# Patient Record
Sex: Male | Born: 1935 | Race: White | Hispanic: No | Marital: Married | Smoking: Never smoker
Health system: Southern US, Community
[De-identification: ages and names within clinical notes are randomized; demographics above are authoritative.]

## PROBLEM LIST (undated history)

## (undated) DIAGNOSIS — G610 Guillain-Barre syndrome: Secondary | ICD-10-CM

## (undated) DIAGNOSIS — Z0110 Encounter for examination of ears and hearing without abnormal findings: Secondary | ICD-10-CM

## (undated) DIAGNOSIS — M47817 Spondylosis without myelopathy or radiculopathy, lumbosacral region: Secondary | ICD-10-CM

## (undated) DIAGNOSIS — R51 Headache: Secondary | ICD-10-CM

## (undated) DIAGNOSIS — N400 Enlarged prostate without lower urinary tract symptoms: Secondary | ICD-10-CM

## (undated) DIAGNOSIS — E7800 Pure hypercholesterolemia, unspecified: Secondary | ICD-10-CM

## (undated) DIAGNOSIS — N529 Male erectile dysfunction, unspecified: Secondary | ICD-10-CM

## (undated) DIAGNOSIS — F0390 Unspecified dementia without behavioral disturbance: Secondary | ICD-10-CM

## (undated) DIAGNOSIS — I10 Essential (primary) hypertension: Secondary | ICD-10-CM

## (undated) HISTORY — DX: Headache: R51

## (undated) HISTORY — DX: Male erectile dysfunction, unspecified: N52.9

## (undated) HISTORY — DX: Encounter for examination of ears and hearing without abnormal findings: Z01.10

## (undated) HISTORY — PX: TRANSURETHRAL RESECTION OF PROSTATE: SHX73

## (undated) HISTORY — DX: Guillain-Barre syndrome: G61.0

## (undated) HISTORY — PX: CATARACT EXTRACTION: SUR2

## (undated) HISTORY — DX: Benign prostatic hyperplasia without lower urinary tract symptoms: N40.0

## (undated) HISTORY — DX: Spondylosis without myelopathy or radiculopathy, lumbosacral region: M47.817

## (undated) HISTORY — PX: AMPUTATION: SHX166

## (undated) HISTORY — PX: OTHER SURGICAL HISTORY: SHX169

---

## 1998-01-17 ENCOUNTER — Ambulatory Visit (HOSPITAL_COMMUNITY): Admission: RE | Admit: 1998-01-17 | Discharge: 1998-01-17 | Payer: Self-pay | Admitting: Neurosurgery

## 2002-04-26 ENCOUNTER — Encounter: Payer: Self-pay | Admitting: Internal Medicine

## 2002-04-26 ENCOUNTER — Encounter: Admission: RE | Admit: 2002-04-26 | Discharge: 2002-04-26 | Payer: Self-pay | Admitting: Internal Medicine

## 2003-01-18 ENCOUNTER — Ambulatory Visit (HOSPITAL_COMMUNITY): Admission: RE | Admit: 2003-01-18 | Discharge: 2003-01-18 | Payer: Self-pay | Admitting: Gastroenterology

## 2010-02-27 ENCOUNTER — Encounter: Admission: RE | Admit: 2010-02-27 | Discharge: 2010-02-27 | Payer: Self-pay | Admitting: Internal Medicine

## 2011-02-13 NOTE — Op Note (Signed)
   NAME:  Jesus Shah, Jesus Shah NO.:  [id number]   MEDICAL RECORD NO.:  [id number]                   PATIENT TYPE:  AMB   LOCATION:  ENDO                                 FACILITY:  [facility]   PHYSICIAN:  Danise Edge, M.D.                DATE OF BIRTH:  Sep 14, 1933   DATE OF PROCEDURE:  01/18/2003  DATE OF DISCHARGE:                                 OPERATIVE REPORT   PROCEDURE PERFORMED:  Screening colonoscopy.   ENDOSCOPIST:  Charolett Bumpers, M.D.   INDICATIONS FOR PROCEDURE:  The patient is a 77 year old male born [birth year].  The patient is scheduled to undergo his first screening  colonoscopy with polypectomy to prevent colon cancer.   PREMEDICATION:  Demerol 50 mg, Versed 5 mg.   DESCRIPTION OF PROCEDURE:  After obtaining informed consent, the patient was  placed in the left lateral decubitus position.  I administered intravenous  Demerol and intravenous Versed to achieve conscious sedation for the  procedure.  The patient's blood pressure, oxygen saturations and cardiac  rhythm were monitored throughout the procedure and documented in the medical  record.   Anal inspection was normal.  Digital rectal exam was normal.  The prostate  was nonnodular.  The pediatric Olympus colonoscope was introduced into the  rectum and advanced to the cecum.  A normal-appearing ileocecal valve was  intubated and the distal ileum inspected.  Colonic preparation for the exam  today was excellent.   Rectum:  Normal.   Sigmoid colon and descending colon:  Normal.   Splenic flexure:  Normal.   Transverse colon:  Normal.   Hepatic flexure:  Normal.   Ascending colon:  Normal.   Cecum and ileocecal valve:  Normal.   Distal ileum:  Normal.   ASSESSMENT:  Normal screening proctocolonoscopy of the cecum.                                                   Danise Edge, M.D.    MJ/MEDQ  D:  01/18/2003  T:  01/18/2003  Job:  [id number]   cc:   Thora Lance, M.D.  [address]  [place]  [place] [address]  Fax: [phone]

## 2012-08-15 DIAGNOSIS — R413 Other amnesia: Secondary | ICD-10-CM | POA: Insufficient documentation

## 2012-12-06 ENCOUNTER — Emergency Department (HOSPITAL_COMMUNITY)
Admission: EM | Admit: 2012-12-06 | Discharge: 2012-12-06 | Disposition: A | Discharge status: Home / Self Care | Payer: Medicare Other | Attending: Emergency Medicine | Admitting: Emergency Medicine

## 2012-12-06 ENCOUNTER — Emergency Department (HOSPITAL_COMMUNITY): Payer: Medicare Other

## 2012-12-06 ENCOUNTER — Encounter (HOSPITAL_COMMUNITY): Payer: Self-pay | Admitting: Emergency Medicine

## 2012-12-06 DIAGNOSIS — I10 Essential (primary) hypertension: Secondary | ICD-10-CM | POA: Insufficient documentation

## 2012-12-06 DIAGNOSIS — D696 Thrombocytopenia, unspecified: Secondary | ICD-10-CM | POA: Insufficient documentation

## 2012-12-06 DIAGNOSIS — F0390 Unspecified dementia without behavioral disturbance: Secondary | ICD-10-CM | POA: Insufficient documentation

## 2012-12-06 DIAGNOSIS — R197 Diarrhea, unspecified: Secondary | ICD-10-CM | POA: Insufficient documentation

## 2012-12-06 DIAGNOSIS — E7800 Pure hypercholesterolemia, unspecified: Secondary | ICD-10-CM | POA: Insufficient documentation

## 2012-12-06 DIAGNOSIS — R112 Nausea with vomiting, unspecified: Secondary | ICD-10-CM | POA: Insufficient documentation

## 2012-12-06 DIAGNOSIS — Z79899 Other long term (current) drug therapy: Secondary | ICD-10-CM | POA: Insufficient documentation

## 2012-12-06 DIAGNOSIS — R5381 Other malaise: Secondary | ICD-10-CM | POA: Insufficient documentation

## 2012-12-06 DIAGNOSIS — Z7982 Long term (current) use of aspirin: Secondary | ICD-10-CM | POA: Insufficient documentation

## 2012-12-06 HISTORY — DX: Essential (primary) hypertension: I10

## 2012-12-06 HISTORY — DX: Pure hypercholesterolemia, unspecified: E78.00

## 2012-12-06 HISTORY — DX: Unspecified dementia, unspecified severity, without behavioral disturbance, psychotic disturbance, mood disturbance, and anxiety: F03.90

## 2012-12-06 LAB — COMPREHENSIVE METABOLIC PANEL
ALT: 12 U/L (ref 0–53)
Alkaline Phosphatase: 65 U/L (ref 39–117)
BUN: 33 mg/dL — ABNORMAL HIGH (ref 6–23)
CO2: 22 mEq/L (ref 19–32)
Calcium: 9.1 mg/dL (ref 8.4–10.5)
GFR calc Af Amer: 73 mL/min — ABNORMAL LOW (ref >90)
GFR calc non Af Amer: 63 mL/min — ABNORMAL LOW (ref >90)
Glucose, Bld: 117 mg/dL — ABNORMAL HIGH (ref 70–99)
Potassium: 3.7 mEq/L (ref 3.5–5.1)
Sodium: 133 mEq/L — ABNORMAL LOW (ref 135–145)
Total Protein: 6.7 g/dL (ref 6.0–8.3)

## 2012-12-06 LAB — CBC WITH DIFFERENTIAL/PLATELET
Basophils Absolute: 0 10*3/uL (ref 0.0–0.1)
Basophils Relative: 0 % (ref 0–1)
Lymphocytes Relative: 9 % — ABNORMAL LOW (ref 12–46)
MCHC: 35.6 g/dL (ref 30.0–36.0)
Neutro Abs: 6.9 10*3/uL (ref 1.7–7.7)
Neutrophils Relative %: 87 % — ABNORMAL HIGH (ref 43–77)
RDW: 13.6 % (ref 11.5–15.5)
WBC: 8 10*3/uL (ref 4.0–10.5)

## 2012-12-06 LAB — POCT I-STAT TROPONIN I: Troponin i, poc: 0.01 ng/mL (ref 0.00–0.08)

## 2012-12-06 LAB — URINALYSIS, ROUTINE W REFLEX MICROSCOPIC
Leukocytes, UA: NEGATIVE
Nitrite: NEGATIVE
Specific Gravity, Urine: 1.025 (ref 1.005–1.030)
Urobilinogen, UA: 1 mg/dL (ref 0.0–1.0)
pH: 5.5 (ref 5.0–8.0)

## 2012-12-06 MED ORDER — SODIUM CHLORIDE 0.9 % IV SOLN
Freq: Once | INTRAVENOUS | Status: AC
  Administered 2012-12-06: 08:00:00 via INTRAVENOUS

## 2012-12-06 MED ORDER — SODIUM CHLORIDE 0.9 % IV BOLUS (SEPSIS)
1000.0000 mL | Freq: Once | INTRAVENOUS | Status: AC
  Administered 2012-12-06: 1000 mL via INTRAVENOUS

## 2012-12-06 MED ORDER — ONDANSETRON 4 MG PO TBDP: 4.0000 mg | ORAL_TABLET | Freq: Three times a day (TID) | ORAL | Status: DC | PRN

## 2012-12-06 NOTE — ED Provider Notes (Signed)
History     CSN: [id number]  Arrival date & time 12/06/12  4098   First MD Initiated Contact with Patient 12/06/12 [phone]      Chief Complaint  Patient presents with  . Emesis    (Consider location/radiation/quality/duration/timing/severity/associated sxs/prior treatment) HPI Comments: Level V caveat apply secondary to dementia  The patient is a 79 year old male with a history of mild hypertension, mild hypercholesterolemia who presents after being found by his wife yesterday with decreased level of consciousness. The patient had reported to her that he had nausea vomiting and diarrhea throughout the day while she was at work, she came around 5:00 and found him sitting in his recliner half closed with evidence of vomit around him. He was severely weak and had much difficulty with walking though it did not appear to be focal weakness according to his spouse. The family member discussed his care with Dr. Anne Hahn per her report and the decision was made to bring to the hospital should he not improve. This morning after having another episode of diarrhea last night and generalized weakness this morning was brought in for evaluation. The spouse and the patient reports that the diarrhea was watery, the patient denies any specific loss of consciousness but does not have a good memory of the events of today. The spouse denies fevers, rashes, swelling, coughing.  The history is provided by the patient and the spouse.    Past Medical History  Diagnosis Date  . Hypertension   . Dementia   . High cholesterol     No past surgical history on file.  No family history on file.  History  Substance Use Topics  . Smoking status: Not on file  . Smokeless tobacco: Not on file  . Alcohol Use: Not on file      Review of Systems  Unable to perform ROS: Dementia    Allergies  Aricept  Home Medications   Current Outpatient Rx  Name  Route  Sig  Dispense  Refill  . acetaminophen (TYLENOL) 500  MG tablet   Oral   Take 500 mg by mouth every 6 (six) hours as needed for pain.         Marland Kitchen aspirin EC 81 MG tablet   Oral   Take 81 mg by mouth daily.         . cholecalciferol (VITAMIN D) 1000 UNITS tablet   Oral   Take 1,000 Units by mouth daily.         . hydrochlorothiazide (MICROZIDE) 12.5 MG capsule   Oral   Take 12.5 mg by mouth daily.         . memantine (NAMENDA) 10 MG tablet   Oral   Take 10 mg by mouth 2 (two) times daily.         Marland Kitchen OVER THE COUNTER MEDICATION   Oral   Take 1 capsule by mouth daily as needed. For constipationj         . rivastigmine (EXELON) 9.5 mg/24hr   Transdermal   Place 1 patch onto the skin daily.         . rosuvastatin (CRESTOR) 5 MG tablet   Oral   Take 5 mg by mouth daily.         . tamsulosin (FLOMAX) 0.4 MG CAPS   Oral   Take 0.4 mg by mouth.         . ondansetron (ZOFRAN ODT) 4 MG disintegrating tablet   Oral   Take 1 tablet (  4 mg total) by mouth every 8 (eight) hours as needed for nausea.   10 tablet   0     BP 117/56  Pulse 68  Temp(Src) 98.5 F (36.9 C)  Resp 17  SpO2 100%  Physical Exam  Nursing note and vitals reviewed. Constitutional: He appears well-developed and well-nourished. No distress.  HENT:  Head: Normocephalic and atraumatic.  Mouth/Throat: Oropharynx is clear and moist. No oropharyngeal exudate.  No signs of head trauma, mucous membranes moist  Eyes: Conjunctivae and EOM are normal. Pupils are equal, round, and reactive to light. Right eye exhibits no discharge. Left eye exhibits no discharge. No scleral icterus.  Neck: Normal range of motion. Neck supple. No JVD present. No thyromegaly present.  Cardiovascular: Normal rate, regular rhythm, normal heart sounds and intact distal pulses.  Exam reveals no gallop and no friction rub.   No murmur heard. Pulmonary/Chest: Effort normal and breath sounds normal. No respiratory distress. He has no wheezes. He has no rales.  Abdominal: Soft.  Bowel sounds are normal. He exhibits no distension and no mass. There is no tenderness.  Normal bowel sounds, no tenderness, normal appearing abdomen, no hepatosplenomegaly  Musculoskeletal: Normal range of motion. He exhibits no edema and no tenderness.  Lymphadenopathy:    He has no cervical adenopathy.  Neurological: He is alert. Coordination normal.  Patient is able to follow commands without difficulty, normal grip strength, able to straight leg raise with 5 out of 5 strength bilaterally, cranial nerves III through XII appear to be intact, finger-nose-finger intact without significant ataxia though the patient does have some difficulty remembering how to go from the finger to nose. With mild redirection he is able to perform this task. There is no asymmetry to strength or sensation, no facial droop  Skin: Skin is warm and dry. No rash noted. No erythema.  Psychiatric: He has a normal mood and affect. His behavior is normal.    ED Course  Procedures (including critical care time)  Labs Reviewed  COMPREHENSIVE METABOLIC PANEL - Abnormal; Notable for the following:    Sodium 133 (*)    Glucose, Bld 117 (*)    BUN 33 (*)    Albumin 3.4 (*)    GFR calc non Af Amer 63 (*)    GFR calc Af Amer 73 (*)    All other components within normal limits  CBC WITH DIFFERENTIAL - Abnormal; Notable for the following:    HCT 37.4 (*)    Platelets 129 (*)    Neutrophils Relative 87 (*)    Lymphocytes Relative 9 (*)    All other components within normal limits  URINALYSIS, ROUTINE W REFLEX MICROSCOPIC - Abnormal; Notable for the following:    Bilirubin Urine SMALL (*)    All other components within normal limits  POCT I-STAT TROPONIN I   Ct Head Wo Contrast  12/06/2012  *RADIOLOGY REPORT*  Clinical Data: Poorly responsive, nausea/vomiting, dementia  CT HEAD WITHOUT CONTRAST  Technique:  Contiguous axial images were obtained from the base of the skull through the vertex without contrast.  Comparison:  02/27/2010  Findings: No evidence of parenchymal hemorrhage or extra-axial fluid collection. No mass lesion, mass effect, or midline shift.  No CT evidence of acute infarction.  Subcortical white matter and periventricular small vessel ischemic changes.  Global cortical atrophy.  No ventriculomegaly.  The visualized paranasal sinuses are essentially clear. The mastoid air cells are unopacified.  No evidence of calvarial fracture.  IMPRESSION: No evidence of  acute intracranial abnormality.  Atrophy with small vessel ischemic changes.   Original Report Authenticated By: Charline Bills, M.D.      1. Nausea and vomiting   2. Diarrhea   3. Thrombocytopenia       MDM  Vital signs appear normal, the patient is not have any focal weakness, there is some concern expressed by the family member that this could have been a mini stroke but with the presence of nausea vomiting and diarrhea it sounds more like a gastrointestinal illness, worthy of further workup, labs, EKG and a CT scan of the head. At this time the patient appears hemodynamically stable without any focal neurologic deficits.  ED ECG REPORT  I personally interpreted this EKG   Date: 12/06/2012   Rate: 67  Rhythm: normal sinus rhythm  QRS Axis: normal  Intervals: normal  ST/T Wave abnormalities: normal  Conduction Disutrbances:none  Narrative Interpretation:   Old EKG Reviewed: none available  The patient has been able to ambulate with minimal difficulty, his family member states that he looks much better than he did prior and feel comfortable taking him home. His laboratory workup has been unremarkable including his blood work urinalysis and imaging other than a slight thrombocytopenia which can be followed up as an outpatient. The patient and his family members were informed of these results, stable for discharge       Vida Roller, MD 12/06/12 1154

## 2012-12-06 NOTE — ED Notes (Signed)
Wife states pt has dementia and when she came home from work yesterday pt had had diarrhea and vomited . Had not gotten into br in time. Wife states pt was" despondent" and not really responding as per norm. She called the dr and he said if not better bring him to er. Wife states that pt did not want to come. She was afraid that pt was having reaction to on of his meds

## 2012-12-06 NOTE — ED Notes (Signed)
N/v/d  several times yesterday and has been weak

## 2012-12-06 NOTE — ED Notes (Signed)
Pt able to tolerate ambulation slowly with assistance.  States he feels better than when he arrived.  Denies dizziness, but still feels weak.

## 2012-12-06 NOTE — ED Notes (Signed)
Yellow bracelet and red socks placed on pt

## 2012-12-26 ENCOUNTER — Ambulatory Visit (INDEPENDENT_AMBULATORY_CARE_PROVIDER_SITE_OTHER): Payer: Medicare Other | Admitting: Neurology

## 2012-12-26 ENCOUNTER — Encounter: Payer: Self-pay | Admitting: Neurology

## 2012-12-26 VITALS — BP 129/66 | HR 68 | Ht 72.0 in | Wt 164.0 lb

## 2012-12-26 DIAGNOSIS — R413 Other amnesia: Secondary | ICD-10-CM

## 2012-12-26 MED ORDER — RIVASTIGMINE TARTRATE 3 MG PO CAPS: 3.0000 mg | ORAL_CAPSULE | Freq: Two times a day (BID) | ORAL | Status: DC

## 2012-12-26 NOTE — Progress Notes (Signed)
Reason for visit: Memory disturbance  Jesus Shah is an 79 y.o. male  History of present illness:  Jesus Shah is a 79 year old right-handed white male with a history of a progressive memory disturbance. The patient recently had a viral illness associated with diarrhea and nausea and vomiting. He required an emergency room visit for IV fluids. The patient will have good days and bad days with memory, and he is currently driving a car. The patient may have some issues with directions and getting lost at times. Overall, the patient has not given up any activities secondary to memory since last seen. The patient is having some problems with irritation of the skin at the sites of the Exelon patch. The patient has itching in these areas. The patient returns to this office for an evaluation.  Past Medical History  Diagnosis Date  . Hypertension   . Dementia   . High cholesterol   . Erectile dysfunction   . Headache   . Prostate enlargement     Benign  . Auditory acuity evaluation     Decreased  . Lumbosacral spondylosis   . Guillain-Barre syndrome     Following flu shot    Past Surgical History  Procedure Laterality Date  . Catatact surgery    . Cataract extraction Bilateral   . Amputation Right     Toe on the right foot  . Transurethral resection of prostate      Family History  Problem Relation Age of Onset  . Cancer Sister     Pancreatic  . Diabetes Sister   . Arthritis Brother     Degenerative  . Arthritis Brother     Degenerative    Social history:  reports that he has never smoked. He does not have any smokeless tobacco history on file. He reports that he does not drink alcohol or use illicit drugs.  Allergies:  Allergies  Allergen Reactions  . Aricept (Donepezil Hcl) Diarrhea    Leg cramps.  . Exelon (Rivastigmine Tartrate) Dermatitis    Adhesive allergy    Medications:  Current Outpatient Prescriptions on File Prior to Visit  Medication Sig Dispense  Refill  . acetaminophen (TYLENOL) 500 MG tablet Take 500 mg by mouth every 6 (six) hours as needed for pain.      Marland Kitchen aspirin EC 81 MG tablet Take 81 mg by mouth daily.      . cholecalciferol (VITAMIN D) 1000 UNITS tablet Take 1,000 Units by mouth daily.      . hydrochlorothiazide (MICROZIDE) 12.5 MG capsule Take 12.5 mg by mouth daily.      . memantine (NAMENDA) 10 MG tablet Take 10 mg by mouth 2 (two) times daily.      . rosuvastatin (CRESTOR) 5 MG tablet Take 5 mg by mouth daily.      . tamsulosin (FLOMAX) 0.4 MG CAPS Take 0.4 mg by mouth.       No current facility-administered medications on file prior to visit.    ROS:  Out of a complete 14 system review of symptoms, the patient complains only of the following symptoms, and all other reviewed systems are negative.  Memory disturbance Confusion Weakness Slurred speech, difficulty with word finding Dizziness Insomnia Restless legs  Blood pressure 129/66, pulse 68, height 6' (1.829 m), weight 164 lb (74.39 kg).  Physical Exam  General: The patient is alert and cooperative at the time of the examination.  Skin: No significant peripheral edema is noted.   Neurologic Exam  Mental status: The mini-mental status examination done today shows a total score of 19/30. The patient is able to name 5 animals in 60 seconds.  Cranial nerves: Facial symmetry is present. Speech is normal, no dysarthria is noted. The patient does appear to have some word finding problems. Extraocular movements are full. Visual fields are full.  Motor: The patient has good strength in all 4 extremities.  Coordination: The patient has good finger-nose-finger and heel-to-shin bilaterally. The patient has significant problems with apraxia with the use of the extremities, left greater than right.  Gait and station: The patient has a normal gait. Tandem gait is slightly unsteady. Romberg is negative. No drift is seen.  Reflexes: Deep tendon reflexes are  symmetric.   Assessment/Plan:  1. Memory disturbance  The patient continues to progress with his memory. The patient likely has developed an allergy to the tape adhesive on the Exelon patch. The patient will be switched to the oral medication. In the past, the patient could not tolerate Aricept secondary to diarrhea. This issue will need to be followed closely on the oral Exelon. The patient will contact our office if he cannot tolerate the medication. The patient is also on Namenda. The driving issue will need to be reevaluated frequently, and the patient may need to give up driving in the near future. The patient will return to this office in about 6 months.   Marlan Palau MD 12/26/2012 5:04 PM  [facility] [address] [place], [place] [address]  Phone [phone] Fax [phone]

## 2013-01-24 ENCOUNTER — Telehealth: Payer: Self-pay

## 2013-01-24 DIAGNOSIS — R410 Disorientation, unspecified: Secondary | ICD-10-CM

## 2013-01-24 NOTE — Telephone Encounter (Signed)
I called the wife. The patient has had gait instability, and word finding problems that have been present for 2 weeks. I will get a CT of the head and an EEG. We may need to repeat blood work as well.

## 2013-01-24 NOTE — Telephone Encounter (Signed)
Patient's spouse called and left message in clinic saying for the last 2 weeks the patient has been unstable, stumbling, cannot get his words out, he is forgetful and feels like he is "floating in the air" when he walks.  Spouse is concerned, and unsure what to do.

## 2013-01-25 ENCOUNTER — Other Ambulatory Visit: Payer: Self-pay | Admitting: Neurology

## 2013-01-25 DIAGNOSIS — F05 Delirium due to known physiological condition: Secondary | ICD-10-CM

## 2013-02-02 ENCOUNTER — Ambulatory Visit
Admission: RE | Admit: 2013-02-02 | Discharge: 2013-02-02 | Disposition: A | Discharge status: Home / Self Care | Payer: Medicare Other | Source: Ambulatory Visit | Attending: Neurology | Admitting: Neurology

## 2013-02-02 DIAGNOSIS — R413 Other amnesia: Secondary | ICD-10-CM

## 2013-02-02 DIAGNOSIS — F05 Delirium due to known physiological condition: Secondary | ICD-10-CM

## 2013-02-03 ENCOUNTER — Telehealth: Payer: Self-pay | Admitting: Neurology

## 2013-02-03 NOTE — Telephone Encounter (Signed)
I called the wife. The MRI study of the brain shows a moderate level small vessel disease that included the brainstem area. There is no evidence of an acute or subacute stroke it would explain his sudden change in walking and slurred speech. The patient seems to have improved with his balance, and he has not had any falls. If they another sudden change occurs, further blood work and urinalysis should be done.

## 2013-02-13 ENCOUNTER — Ambulatory Visit: Payer: Self-pay | Admitting: Nurse Practitioner

## 2013-02-22 ENCOUNTER — Telehealth: Payer: Self-pay | Admitting: Neurology

## 2013-02-22 MED ORDER — ALPRAZOLAM 0.25 MG PO TABS: 0.2500 mg | ORAL_TABLET | Freq: Three times a day (TID) | ORAL | Status: DC | PRN

## 2013-02-22 NOTE — Telephone Encounter (Signed)
I called the patient. The patient appears to be going into a low-grade delirium state. He is becoming anxious and nervous, pacing the floor, with occasional hallucinations. The patient is not sleeping at night, once again pacing the floors. He is complaining somewhat of some neck discomfort. The patient has had some issues with this for the last 4 days. He is gone off of the Exelon for 3 days, but this made no difference. I will give a trial on low-dose alprazolam, taking the 0.25 mg tablets 3 times a day if needed. If the patient continues to worsen, he may need to go to the emergency room.

## 2013-02-26 ENCOUNTER — Other Ambulatory Visit: Payer: Self-pay

## 2013-02-26 MED ORDER — RIVASTIGMINE TARTRATE 3 MG PO CAPS: 3.0000 mg | ORAL_CAPSULE | Freq: Two times a day (BID) | ORAL | Status: DC

## 2013-02-26 MED ORDER — MEMANTINE HCL 10 MG PO TABS: 10.0000 mg | ORAL_TABLET | Freq: Two times a day (BID) | ORAL | Status: AC

## 2013-03-09 ENCOUNTER — Telehealth: Payer: Self-pay | Admitting: Neurology

## 2013-03-09 DIAGNOSIS — D518 Other vitamin B12 deficiency anemias: Secondary | ICD-10-CM

## 2013-03-09 NOTE — Telephone Encounter (Signed)
Pt's wife Chyrl Civatte is wanting to see if she can get her husband can get in for an emergency visit before the weekend. He is having difficulty with the medication ALPRAZolam (XANAX) 0.25 MG tablet. States it makes him hyper and pt thinks it helps and wants to continue to take it but really needs to talk with the Dr. About the symptoms he is having with the medication. Pt's wife thinks he is going down him fast.

## 2013-03-09 NOTE — Telephone Encounter (Signed)
I called patient. I talked with the wife. The patient appears to be in a low-grade delirium, agitated, not sleeping well. I'll check blood work and urinalysis. We may need a revisit sometime next week. The etiology of this is not clear.

## 2013-03-09 NOTE — Telephone Encounter (Signed)
I spoke to wife who was tearful, and patient is having a hard time, very agitated.  She said last night the xanax took 11/2 hours to work.  She would like to talk to the doctor.  Her number [phone].

## 2013-03-10 ENCOUNTER — Other Ambulatory Visit: Payer: Self-pay | Admitting: Neurology

## 2013-03-11 LAB — URINALYSIS, ROUTINE W REFLEX MICROSCOPIC
Glucose, UA: NEGATIVE
Ketones, UA: NEGATIVE
RBC, UA: NEGATIVE
Specific Gravity, UA: 1.015 (ref 1.005–1.030)
Urobilinogen, Ur: 0.2 mg/dL (ref 0.0–1.9)

## 2013-03-11 LAB — COMPREHENSIVE METABOLIC PANEL
ALT: 12 IU/L (ref 0–44)
AST: 20 IU/L (ref 0–40)
Albumin/Globulin Ratio: 1.9 (ref 1.1–2.5)
Alkaline Phosphatase: 67 IU/L (ref 39–117)
BUN/Creatinine Ratio: 15 (ref 10–22)
Chloride: 95 mmol/L — ABNORMAL LOW (ref 97–108)
GFR calc Af Amer: 69 mL/min/{1.73_m2} (ref >59)
GFR calc non Af Amer: 60 mL/min/{1.73_m2} (ref >59)
Potassium: 4.1 mmol/L (ref 3.5–5.2)
Sodium: 133 mmol/L — ABNORMAL LOW (ref 134–144)
Total Bilirubin: 0.5 mg/dL (ref 0.0–1.2)
Total Protein: 6.6 g/dL (ref 6.0–8.5)

## 2013-03-11 LAB — CBC WITH DIFFERENTIAL
Basos: 0 % (ref 0–3)
Eos: 2 % (ref 0–5)
HCT: 40.8 % (ref 37.5–51.0)
Immature Grans (Abs): 0 10*3/uL (ref 0.0–0.1)
Lymphocytes Absolute: 1.3 10*3/uL (ref 0.7–3.1)
MCHC: 34.1 g/dL (ref 31.5–35.7)
Monocytes Absolute: 0.4 10*3/uL (ref 0.1–0.9)
Monocytes: 6 % (ref 4–12)
Neutrophils Relative %: 68 % (ref 40–74)
Platelets: 189 10*3/uL (ref 155–379)
RDW: 13.5 % (ref 12.3–15.4)

## 2013-03-11 LAB — AMMONIA: Ammonia: 30 ug/dL (ref 27–102)

## 2013-03-12 ENCOUNTER — Telehealth: Payer: Self-pay | Admitting: Neurology

## 2013-03-12 NOTE — Telephone Encounter (Signed)
i called the patient. The blood work is OK, with the exception that the sodium was 133, but this is stable. The patient is to stop the excelon, and things do not improve, we will need to see him in the office next week.

## 2013-03-17 ENCOUNTER — Telehealth: Payer: Self-pay | Admitting: *Deleted

## 2013-03-17 DIAGNOSIS — F05 Delirium due to known physiological condition: Secondary | ICD-10-CM

## 2013-03-17 NOTE — Addendum Note (Signed)
Addended by: Stephanie Acre on: 03/17/2013 04:52 PM   Modules accepted: Orders

## 2013-03-17 NOTE — Telephone Encounter (Signed)
I called the wife, and I left a message. I will call back later.

## 2013-03-17 NOTE — Telephone Encounter (Signed)
Message copied by Avie Echevaria on Fri Mar 17, 2013  9:55 AM ------      Message from: Seth Bake      Created: Thu Mar 16, 2013  8:43 AM      Contact: spouse       Randa Evens, patients wife, states that patient continues to be very confused at times.  He can barely walk with help.  Having nausea.  Please call.  ------

## 2013-03-17 NOTE — Telephone Encounter (Signed)
I called the patient's wife. The patient is having a gradual decline in his abilities. The patient is sleeping some better, and he is eating and drinking. I'll get him set up for a CAT scan. He needs to be seen in the office at some point soon. Unfortunately, I am out-of-town next week.

## 2013-03-17 NOTE — Telephone Encounter (Signed)
Spoke with Jesus Shah and confirmed her earlier call; [name]'s condition continues to decline: he is now unable to bath himself.  Please advise.

## 2013-03-27 ENCOUNTER — Telehealth: Payer: Self-pay | Admitting: Neurology

## 2013-03-27 NOTE — Telephone Encounter (Signed)
I did not call the patient, but I will try to work him in tomorrow at 4:00pm.

## 2013-03-28 ENCOUNTER — Encounter: Payer: Self-pay | Admitting: Neurology

## 2013-03-28 ENCOUNTER — Ambulatory Visit (INDEPENDENT_AMBULATORY_CARE_PROVIDER_SITE_OTHER): Payer: Medicare Other | Admitting: Neurology

## 2013-03-28 VITALS — BP 136/64 | HR 66 | Wt 168.0 lb

## 2013-03-28 DIAGNOSIS — R413 Other amnesia: Secondary | ICD-10-CM

## 2013-03-28 MED ORDER — ESCITALOPRAM OXALATE 5 MG PO TABS: 5.0000 mg | ORAL_TABLET | Freq: Every day | ORAL | Status: DC

## 2013-03-28 NOTE — Progress Notes (Signed)
Reason for visit: Dementia  Jesus Shah is an 79 y.o. male  History of present illness:  Jesus Shah is a 79 year old right-handed white male with a history of a progressive dementia. The patient initially was seen in 2011 with a one and one half year history of a progressive memory problem. In March of 2014, the patient had a viral illness with nausea and vomiting and diarrhea. Since that time, the patient has had worsening confusion, and he is developed some issues with walking. The patient has not been sleeping well, and he has been somewhat agitated. The patient was felt possibly to have a low-grade delirium, and the Exelon was discontinued. An EEG study was ordered, but this was never done. A MRI scan of the brain was done showing chronic stable mild to moderate small vessel disease. The patient returns to this office for a reevaluation. The wife is concerned that he is requiring increasing assistance for activities of daily living.  Past Medical History  Diagnosis Date  . Hypertension   . Dementia   . High cholesterol   . Erectile dysfunction   . Headache(784.0)   . Prostate enlargement     Benign  . Auditory acuity evaluation     Decreased  . Lumbosacral spondylosis   . Guillain-Barre syndrome     Following flu shot    Past Surgical History  Procedure Laterality Date  . Catatact surgery    . Cataract extraction Bilateral   . Amputation Right     Toe on the right foot  . Transurethral resection of prostate      Family History  Problem Relation Age of Onset  . Cancer Sister     Pancreatic  . Diabetes Sister   . Arthritis Brother     Degenerative  . Arthritis Brother     Degenerative    Social history:  reports that he has never smoked. He does not have any smokeless tobacco history on file. He reports that he does not drink alcohol or use illicit drugs.  Allergies:  Allergies  Allergen Reactions  . Aricept (Donepezil Hcl) Diarrhea    Leg cramps.  .  Exelon (Rivastigmine Tartrate) Dermatitis    Adhesive allergy    Medications:  Current Outpatient Prescriptions on File Prior to Visit  Medication Sig Dispense Refill  . acetaminophen (TYLENOL) 500 MG tablet Take 500 mg by mouth every 6 (six) hours as needed for pain.      Marland Kitchen ALPRAZolam (XANAX) 0.25 MG tablet Take 1 tablet (0.25 mg total) by mouth 3 (three) times daily as needed for sleep or anxiety.  30 tablet  1  . aspirin EC 81 MG tablet Take 81 mg by mouth daily.      . cholecalciferol (VITAMIN D) 1000 UNITS tablet Take 1,000 Units by mouth daily.      Marland Kitchen docusate sodium (COLACE) 100 MG capsule Take 100 mg by mouth 2 (two) times daily.      . hydrochlorothiazide (MICROZIDE) 12.5 MG capsule Take 12.5 mg by mouth daily.      . memantine (NAMENDA) 10 MG tablet Take 1 tablet (10 mg total) by mouth 2 (two) times daily.  60 tablet  3  . rivastigmine (EXELON) 3 MG capsule Take 1 capsule (3 mg total) by mouth 2 (two) times daily.  60 capsule  3  . rosuvastatin (CRESTOR) 5 MG tablet Take 5 mg by mouth daily.      . tamsulosin (FLOMAX) 0.4 MG CAPS Take 0.4 mg  by mouth.       No current facility-administered medications on file prior to visit.    ROS:  Out of a complete 14 system review of symptoms, the patient complains only of the following symptoms, and all other reviewed systems are negative.  Memory disturbance, confusion Gait instability  Blood pressure 136/64, pulse 66, weight 168 lb (76.204 kg).  Physical Exam  General: The patient is alert and cooperative at the time of the examination.  Skin: 1+ edema at the ankles is noted bilaterally.   Neurologic Exam  Mental status: Mini-Mental status examination done today shows a total score 21/30.   Cranial nerves: Facial symmetry is present. Speech is normal, no aphasia or dysarthria is noted. Extraocular movements are full. Visual fields are full.  Motor: The patient has good strength in all 4 extremities.  Coordination: The  patient has good finger-nose-finger and heel-to-shin bilaterally.  Gait and station: The patient has a normal gait, but there is decreased arm swing, left greater than right. The posture somewhat stooped. The patient does shuffle his feet with turns.. Tandem gait is slightly unsteady. Romberg is negative. No drift is seen. The patient is able to arise from a seated position with arms crossed.  Reflexes: Deep tendon reflexes are symmetric.   Assessment/Plan:  1. Memory disturbance, dementia  2. Gait disturbance, possible early parkinsonism  The patient could be developing a Lewy body dementia. The patient is having some issues with mild agitation, difficulty with sleeping. The patient may have an underlying depression issue. The patient will be placed on Lexapro in low dose. The patient is currently off of the Exelon. The patient is actually scoring better on the Mini-Mental status examination today than he did in late April 2014. Blood work has been done to include a comprehensive metabolic profile, CBC, urinalysis. This was unremarkable with exception of a slight hyponatremia of 133. The patient is on hydrochlorothiazide. The patient followup in 3 months. The EEG study will be reordered.  Marlan Palau MD 03/28/2013 8:48 PM  [facility] [address] [place], [place] [address]  Phone [phone] Fax [phone]

## 2013-04-03 ENCOUNTER — Telehealth: Payer: Self-pay | Admitting: Emergency Medicine

## 2013-04-03 ENCOUNTER — Telehealth: Payer: Self-pay | Admitting: Neurology

## 2013-04-03 NOTE — Telephone Encounter (Signed)
I spoke to wife and the patient is taking the Lexapro daily in the evening but it is making him agitated and unsteady on his feet.  She has also stopped the Xanax, said it made him agitated also, she didn't think he should take that with the Lexapro.  She wants to know if she should give him 1/2 of the Lexapro.  She can be reached at [phone].

## 2013-04-03 NOTE — Telephone Encounter (Signed)
I called the patient's wife. The Lexapro initially seemed to help for 3 days, and now makes him agitated. Alprazolam does the same thing. The patient will drop back to 2.5 mg, but if this does not work, the patient likely is better off on no medication.

## 2013-04-03 NOTE — Telephone Encounter (Signed)
Pt's wife states medication Escitalopram is having affect from with sleeping. Please call pt to go over, and if pt needs to just take a half of a pill or what Dr. Anne Hahn prefers. Thanks

## 2013-04-03 NOTE — Telephone Encounter (Signed)
Pt's wife states medicaition Escitalopram is having affects from with sleeping. Please call pt to go over if pt needs to just take a half of pill or what Dr. Anne Hahn prefers. Thanks

## 2013-04-04 NOTE — Telephone Encounter (Signed)
Please see previous phone note, issued has been addressed by doctor.

## 2013-04-05 ENCOUNTER — Telehealth: Payer: Self-pay | Admitting: Neurology

## 2013-04-05 MED ORDER — DIVALPROEX SODIUM 250 MG PO DR TAB: 250.0000 mg | DELAYED_RELEASE_TABLET | Freq: Every day | ORAL | Status: DC

## 2013-04-05 NOTE — Telephone Encounter (Signed)
I spoke to wife and she said he took Escitalopram 1/2 tab yesterday morning and is still anxious, but it is not as bad.  He only took the 1/2 tab once.  Told her I would let doctor know.

## 2013-04-05 NOTE — Telephone Encounter (Signed)
I called the wife. The patient is not able to tolerate Lexapro. This will be discontinued. I will try low-dose Depakote.

## 2013-04-07 ENCOUNTER — Telehealth: Payer: Self-pay | Admitting: Neurology

## 2013-04-10 NOTE — Telephone Encounter (Signed)
I called and spoke to wife.   She relayed that pt had falled on Friday the 04-07-12 and hit his temple walking to door , hit temple on doorway/molding, then again got his feet tangled in sheets when getting out of bed and bruised R jaw, and R ear lobe (hit end table).   She wanted to relay also that the depakote 250mg  that pt is taking may not be enough.   She feels that it has helped some.  This being taken at 0900.   She gave it today at 1200 and I relayed that she may want to give to him at night at bedtime sicne it can make pt drowsy.   I told her to start this tomorrow and see how he does.  If any change from what I told her we will call her back.  She verbalized understanding.

## 2013-04-10 NOTE — Telephone Encounter (Signed)
I called the patient and I talked with the wife. The patient is getting some benefit from the Depakote. He had 2 falls last week. I do not think this is related to the Depakote. The patient will go up on the Depakote taking 250 mg twice daily.

## 2013-04-17 ENCOUNTER — Telehealth: Payer: Self-pay | Admitting: Neurology

## 2013-04-17 NOTE — Telephone Encounter (Signed)
Patient's wife called stating the patient is getting worse, she feels like the patient has gone from a eight to a one. Patient's wife stated Depakote isn't working at night, and she  would like to speak with physician.

## 2013-04-17 NOTE — Telephone Encounter (Signed)
I called the wife. The patient is continuing to get worse at a rapid pace. The patient is developing an increasing problem with his walking. When last seen in office, the patient has parkinsonian features. The patient may be developing a lewy body dementia. I'll have him come off of the Depakote, and I'll get him worked into the office.

## 2013-04-18 ENCOUNTER — Other Ambulatory Visit: Payer: Self-pay | Admitting: Neurology

## 2013-04-18 ENCOUNTER — Ambulatory Visit (INDEPENDENT_AMBULATORY_CARE_PROVIDER_SITE_OTHER): Payer: Medicare Other | Admitting: Neurology

## 2013-04-18 ENCOUNTER — Encounter: Payer: Self-pay | Admitting: Neurology

## 2013-04-18 ENCOUNTER — Other Ambulatory Visit (INDEPENDENT_AMBULATORY_CARE_PROVIDER_SITE_OTHER): Payer: Medicare Other

## 2013-04-18 ENCOUNTER — Telehealth: Payer: Self-pay | Admitting: Neurology

## 2013-04-18 VITALS — BP 119/63 | HR 71 | Ht 72.0 in | Wt 173.0 lb

## 2013-04-18 DIAGNOSIS — R410 Disorientation, unspecified: Secondary | ICD-10-CM

## 2013-04-18 DIAGNOSIS — Z0289 Encounter for other administrative examinations: Secondary | ICD-10-CM

## 2013-04-18 DIAGNOSIS — Z5181 Encounter for therapeutic drug level monitoring: Secondary | ICD-10-CM

## 2013-04-18 DIAGNOSIS — R413 Other amnesia: Secondary | ICD-10-CM

## 2013-04-18 MED ORDER — QUETIAPINE FUMARATE 25 MG PO TABS: 25.0000 mg | ORAL_TABLET | Freq: Every day | ORAL | Status: DC

## 2013-04-18 NOTE — Progress Notes (Signed)
Reason for visit: Dementia  Jesus Shah is an 79 y.o. male  History of present illness:  Jesus Shah is a 79 year old right-handed white male with a history of a progressive dementia. Since March of 2014, the patient has had a significant decline in his overall functional level. He has begun having increased confusion, with wide swings in his mental status. The patient has developed visual hallucinations, and he is no longer sleeping well at nighttime. The patient is up and down taking "cat naps" throughout the night and day. The patient has been tried on a multitude of medications including Depakote, alprazolam, Lexapro, and he currently is on Namenda. These medications have not been beneficial to help the behavior. The patient was seen approximately 3 weeks ago, and he at that time was noted to have some mild parkinsonian features. The possibility of a Lewy body dementia as been entertained. The patient has had a significant worsening of his mental status recently, and he comes in for an evaluation. His balance and gait has changed, and he currently is requiring a walker at times for ambulation. The patient is falling, and he has hit his head. The patient denies any headache. An EEG study has been ordered, not yet done. The patient has been noted to have some swelling of the legs this morning.  Past Medical History  Diagnosis Date  . Hypertension   . Dementia   . High cholesterol   . Erectile dysfunction   . Headache(784.0)   . Prostate enlargement     Benign  . Auditory acuity evaluation     Decreased  . Lumbosacral spondylosis   . Guillain-Barre syndrome     Following flu shot    Past Surgical History  Procedure Laterality Date  . Catatact surgery    . Cataract extraction Bilateral   . Amputation Right     Toe on the right foot  . Transurethral resection of prostate      Family History  Problem Relation Age of Onset  . Cancer Sister     Pancreatic  . Diabetes Sister    . Arthritis Brother     Degenerative  . Arthritis Brother     Degenerative    Social history:  reports that he has never smoked. He does not have any smokeless tobacco history on file. He reports that he does not drink alcohol or use illicit drugs.  Allergies:  Allergies  Allergen Reactions  . Aricept (Donepezil Hcl) Diarrhea    Leg cramps.  . Exelon (Rivastigmine Tartrate) Dermatitis    Adhesive allergy    Medications:  Current Outpatient Prescriptions on File Prior to Visit  Medication Sig Dispense Refill  . acetaminophen (TYLENOL) 500 MG tablet Take 500 mg by mouth every 6 (six) hours as needed for pain.      Marland Kitchen aspirin EC 81 MG tablet Take 81 mg by mouth daily.      . cholecalciferol (VITAMIN D) 1000 UNITS tablet Take 1,000 Units by mouth daily.      Marland Kitchen docusate sodium (COLACE) 100 MG capsule Take 100 mg by mouth 2 (two) times daily.      . hydrochlorothiazide (MICROZIDE) 12.5 MG capsule Take 12.5 mg by mouth daily.      . memantine (NAMENDA) 10 MG tablet Take 1 tablet (10 mg total) by mouth 2 (two) times daily.  60 tablet  3  . rosuvastatin (CRESTOR) 5 MG tablet Take 5 mg by mouth daily.      . tamsulosin (  FLOMAX) 0.4 MG CAPS Take 0.4 mg by mouth.       No current facility-administered medications on file prior to visit.    ROS:  Out of a complete 14 system review of symptoms, the patient complains only of the following symptoms, and all other reviewed systems are negative.  Swelling in the legs Double vision Memory loss, confusion, weakness, slurred speech Dizziness Anxiety, insomnia, decreased energy, restless legs  Blood pressure 119/63, pulse 71, height 6' (1.829 m), weight 173 lb (78.472 kg).  Physical Exam  General: The patient is alert and cooperative at the time of the examination. Mild masking of the face is noted.  Skin: 1-2+ edema of ankles is noted bilaterally.   Neurologic Exam  Mental status: Mini-Mental status examination done today shows a  total score of 15/30. The patient is able to name 7 animals in 60 seconds.  Cranial nerves: Facial symmetry is present. Speech is normal, no aphasia or dysarthria is noted. The patient has mild hypophonia. Extraocular movements are full. Visual fields are full.  Motor: The patient has good strength in all 4 extremities.  Coordination: The patient has severe apraxia with finger-nose-finger and heel-to-shin bilaterally.  Gait and station: The patient is able to arise from a seated position with arms crossed with some difficulty. Once up, the patient has a slightly stooped posture, decreased arm swing with the left greater than right arms, the left arm is in slight flexion. No tremor is noted.  Reflexes: Deep tendon reflexes are symmetric.   Assessment/Plan:  One. Progressive dementia  2. Parkinsonism  The patient may have a Lewy body dementia. The patient seems to have progressed quite rapidly, however. The patient has fallen, and he has hit his head on occasion. We will perform a MRI the brain with and without gadolinium to rule out limbic encephalitis. The patient will undergo an EEG study. Further blood work will be done today. The patient will be placed on low-dose Seroquel. The patient followup in 3-4 months. We will refer this patient to the [facility] memory disorders clinic. The patient demonstrates wide swings in mental status. The patient has a 6 point drop in the Mini-Mental status examination from 3 weeks ago. Mini-Mental examination 3 weeks ago revealed an improvement from the Mini-Mental status examination done 3 months ago. Rapid swings in mental status are characteristic for Lewy body dementia.  Marlan Palau MD 04/18/2013 7:27 PM  [facility] [address] [place], [place] [address]  Phone [phone] Fax [phone]

## 2013-04-18 NOTE — Telephone Encounter (Signed)
I called the patient and I talked with the wife. The EEG study done today was unremarkable.

## 2013-04-18 NOTE — Procedures (Signed)
  History:  Jesus Shah is a 79 year old gentleman with a history of progressive dementia. The patient has recently developed wide variations in cognitive status, and parkinsonian features. The patient is being evaluated for his altered mental status.  EEG classification: Normal awake  Description of the recording: The background rhythms of this recording consists of a fairly well modulated medium amplitude alpha rhythm of 8 Hz that is reactive to eye opening and closure. As the record progresses, the patient appears to remain in the waking state throughout the recording. Photic stimulation was performed, resulting in a bilateral and symmetric photic driving response. Hyperventilation was not performed. At no time during the recording does there appear to be evidence of spike or spike wave discharges or evidence of focal slowing. EKG monitor shows no evidence of cardiac rhythm abnormalities with a heart rate of 84.  Impression: This is a normal EEG recording in the waking state. No evidence of ictal or interictal discharges are seen.

## 2013-04-19 ENCOUNTER — Telehealth: Payer: Self-pay | Admitting: Neurology

## 2013-04-19 LAB — SEDIMENTATION RATE: Sed Rate: 3 mm/hr (ref 0–30)

## 2013-04-19 NOTE — Telephone Encounter (Signed)
Pt's wife called states the medication QUEtiapine (SEROQUEL) 25 MG tablet was causing him to have hallucinations last night and should he continue this medication but she states he was having some of these when he got home. Needs a nurse or Dr. Anne Hahn to call her back. Thanks

## 2013-04-19 NOTE — Telephone Encounter (Signed)
I called the wife. The patient took Seroquel at 10 PM, and he slept for 2 hours. The patient awakened at 12 midnight with visual hallucinations. They are to continue the current dose for several more days. If this pattern continues, we'll increase the dose to 50 mg at night.

## 2013-04-19 NOTE — Telephone Encounter (Signed)
I called and spoke with patient's spouse who is concern about her husband having hallucinations before and after taking Seroquel. Patient took Seroquel last night(10:00 pm) and went straight to bed around 10:30 and awoke around 12:00 this morning having hallucinations. Patient's spouse stated she doesn't know if it was the medication, but she wants to speak with physician before giving this medication tonight to her husband.

## 2013-04-21 ENCOUNTER — Encounter: Payer: Self-pay | Admitting: Neurology

## 2013-04-21 ENCOUNTER — Telehealth: Payer: Self-pay | Admitting: Neurology

## 2013-04-21 NOTE — Telephone Encounter (Signed)
Dr Willis, please advise.  Thank you.  

## 2013-04-21 NOTE — Telephone Encounter (Signed)
I called patient. I left a message. If the patient is doing well on the medication, they are not increase the Seroquel. If he is still having problems with sleep, and hallucinations, they are to increase to 50 mg at night.

## 2013-04-23 ENCOUNTER — Encounter: Payer: Self-pay | Admitting: Neurology

## 2013-04-24 NOTE — Progress Notes (Signed)
Quick Note:  Spoke with patient's wife and relayed results of blood work; she understood and had no questions. She did have questions about Mr Jesus Shah's Rx - Seroquel. He has been taking as it as prescribed for 1 week and is still waking every 5-10 minutes. Please advise.   ______

## 2013-04-25 ENCOUNTER — Telehealth: Payer: Self-pay | Admitting: Neurology

## 2013-04-26 ENCOUNTER — Telehealth: Payer: Self-pay | Admitting: Neurology

## 2013-04-26 NOTE — Telephone Encounter (Signed)
I called the patient and I talked with the wife. The patient is not sleeping with 25 mg of Seroquel, the dose should be increased to 50 mg at night, we will continue go up on the dose depending on how he is doing.

## 2013-04-26 NOTE — Telephone Encounter (Signed)
Wife states the patient is not sleeping and needs advice on what she needs and can do about this. Please advise.

## 2013-04-27 ENCOUNTER — Telehealth: Payer: Self-pay | Admitting: Neurology

## 2013-04-27 NOTE — Telephone Encounter (Signed)
I called the wife. The patient apparently had some slight agitation after taking the Seroquel within 20 minutes. The patient however, did sleep 2 hours, which is unusual for him. The patient usually will sleep only about 15 minutes. I would continue on the 50 mg dose of Seroquel. If the patient seems to be improved over the next 7-10 days, the dose can be increased further.

## 2013-04-27 NOTE — Telephone Encounter (Signed)
I called and spoke with patient's daughter who stated that last night after increasing the Seroquel that he was having seizure like symptoms. Patient's daughter said from 10-11:30pm her father was upset and was taking 10 minute cat naps through out the night. Patient's daughter would like to know could the increase Seroquel could've cause the reaction.

## 2013-04-27 NOTE — Telephone Encounter (Signed)
Still having problems with sleep would like some help and a call back. Please advise.

## 2013-04-28 NOTE — Telephone Encounter (Signed)
Dr. Anne Hahn has spoken to wife, as per previous note.

## 2013-05-02 ENCOUNTER — Telehealth: Payer: Self-pay | Admitting: Neurology

## 2013-05-03 ENCOUNTER — Other Ambulatory Visit: Payer: Self-pay

## 2013-05-03 DIAGNOSIS — R51 Headache: Secondary | ICD-10-CM

## 2013-05-03 MED ORDER — QUETIAPINE FUMARATE 25 MG PO TABS: 75.0000 mg | ORAL_TABLET | Freq: Every day | ORAL | Status: AC

## 2013-05-03 NOTE — Telephone Encounter (Signed)
I called patient. I talked with the wife. The patient is doing relatively well on the Seroquel, but needs more. I will go up to 75 mg at night. The [employer] does not cover [employer]. They will check into [facility] to see if the insurance will cover that.

## 2013-05-03 NOTE — Telephone Encounter (Signed)
Patient's spouse has called to say that their insurance doesn't cover [facility].

## 2013-05-04 ENCOUNTER — Telehealth: Payer: Self-pay | Admitting: Neurology

## 2013-05-04 ENCOUNTER — Other Ambulatory Visit: Payer: Self-pay | Admitting: Diagnostic Neuroimaging

## 2013-05-04 DIAGNOSIS — R413 Other amnesia: Secondary | ICD-10-CM

## 2013-05-04 DIAGNOSIS — Z5181 Encounter for therapeutic drug level monitoring: Secondary | ICD-10-CM

## 2013-05-04 NOTE — Telephone Encounter (Signed)
I called the patient. I talked with the wife. The MRI the brain shows no change from 4 months ago. No evolutionary changes that would suggest limbic encephalitis. We may need prior approval to go to [facility] memory disorders clinic. I will call the insurance at [id number].

## 2013-05-05 ENCOUNTER — Telehealth: Payer: Self-pay | Admitting: Neurology

## 2013-05-08 ENCOUNTER — Other Ambulatory Visit: Payer: Medicare Other

## 2013-05-10 ENCOUNTER — Telehealth: Payer: Self-pay | Admitting: Neurology

## 2013-05-11 NOTE — Telephone Encounter (Signed)
Message has been noted. I did not called the patient.

## 2013-05-11 NOTE — Telephone Encounter (Signed)
Called and spoke to patients wife. She went ahead and took her husband to appointment  Today  At [facility] . Please let Dr. Anne Hahn no. Doctor at [facility] will send Dr.Willis the Notes.

## 2013-05-21 ENCOUNTER — Emergency Department (HOSPITAL_COMMUNITY): Payer: Medicare Other

## 2013-05-21 ENCOUNTER — Encounter (HOSPITAL_COMMUNITY): Payer: Self-pay | Admitting: Emergency Medicine

## 2013-05-21 ENCOUNTER — Inpatient Hospital Stay (HOSPITAL_COMMUNITY)
Admission: EM | Admit: 2013-05-21 | Discharge: 2013-06-28 | DRG: 177 | Disposition: E | Payer: Medicare Other | Attending: Internal Medicine | Admitting: Internal Medicine

## 2013-05-21 DIAGNOSIS — S98139A Complete traumatic amputation of one unspecified lesser toe, initial encounter: Secondary | ICD-10-CM

## 2013-05-21 DIAGNOSIS — I10 Essential (primary) hypertension: Secondary | ICD-10-CM | POA: Diagnosis present

## 2013-05-21 DIAGNOSIS — J9600 Acute respiratory failure, unspecified whether with hypoxia or hypercapnia: Secondary | ICD-10-CM | POA: Diagnosis present

## 2013-05-21 DIAGNOSIS — R0600 Dyspnea, unspecified: Secondary | ICD-10-CM

## 2013-05-21 DIAGNOSIS — Z79899 Other long term (current) drug therapy: Secondary | ICD-10-CM

## 2013-05-21 DIAGNOSIS — Z66 Do not resuscitate: Secondary | ICD-10-CM | POA: Diagnosis not present

## 2013-05-21 DIAGNOSIS — R451 Restlessness and agitation: Secondary | ICD-10-CM

## 2013-05-21 DIAGNOSIS — R4182 Altered mental status, unspecified: Secondary | ICD-10-CM | POA: Insufficient documentation

## 2013-05-21 DIAGNOSIS — F0390 Unspecified dementia without behavioral disturbance: Secondary | ICD-10-CM | POA: Diagnosis present

## 2013-05-21 DIAGNOSIS — Z7982 Long term (current) use of aspirin: Secondary | ICD-10-CM

## 2013-05-21 DIAGNOSIS — E876 Hypokalemia: Secondary | ICD-10-CM | POA: Diagnosis not present

## 2013-05-21 DIAGNOSIS — Z515 Encounter for palliative care: Secondary | ICD-10-CM

## 2013-05-21 DIAGNOSIS — J690 Pneumonitis due to inhalation of food and vomit: Principal | ICD-10-CM | POA: Diagnosis present

## 2013-05-21 DIAGNOSIS — F0280 Dementia in other diseases classified elsewhere without behavioral disturbance: Secondary | ICD-10-CM | POA: Diagnosis present

## 2013-05-21 DIAGNOSIS — R1310 Dysphagia, unspecified: Secondary | ICD-10-CM | POA: Diagnosis present

## 2013-05-21 DIAGNOSIS — R319 Hematuria, unspecified: Secondary | ICD-10-CM | POA: Diagnosis present

## 2013-05-21 DIAGNOSIS — E785 Hyperlipidemia, unspecified: Secondary | ICD-10-CM | POA: Diagnosis present

## 2013-05-21 DIAGNOSIS — N400 Enlarged prostate without lower urinary tract symptoms: Secondary | ICD-10-CM | POA: Diagnosis present

## 2013-05-21 DIAGNOSIS — J189 Pneumonia, unspecified organism: Secondary | ICD-10-CM | POA: Diagnosis present

## 2013-05-21 LAB — POCT I-STAT TROPONIN I: Troponin i, poc: 0.02 ng/mL (ref 0.00–0.08)

## 2013-05-21 LAB — DIFFERENTIAL
Basophils Absolute: 0 10*3/uL (ref 0.0–0.1)
Basophils Relative: 0 % (ref 0–1)
Eosinophils Absolute: 0 10*3/uL (ref 0.0–0.7)
Eosinophils Relative: 0 % (ref 0–5)
Monocytes Absolute: 0.7 10*3/uL (ref 0.1–1.0)
Monocytes Relative: 5 % (ref 3–12)

## 2013-05-21 LAB — CBC
HCT: 37.7 % — ABNORMAL LOW (ref 39.0–52.0)
Hemoglobin: 13.5 g/dL (ref 13.0–17.0)
MCH: 32.5 pg (ref 26.0–34.0)
MCHC: 35.8 g/dL (ref 30.0–36.0)
MCV: 90.6 fL (ref 78.0–100.0)
RDW: 12.6 % (ref 11.5–15.5)

## 2013-05-21 LAB — COMPREHENSIVE METABOLIC PANEL
Albumin: 3.9 g/dL (ref 3.5–5.2)
BUN: 26 mg/dL — ABNORMAL HIGH (ref 6–23)
Calcium: 9.6 mg/dL (ref 8.4–10.5)
Chloride: 96 mEq/L (ref 96–112)
Creatinine, Ser: 1.1 mg/dL (ref 0.50–1.35)
Total Bilirubin: 0.5 mg/dL (ref 0.3–1.2)

## 2013-05-21 LAB — URINE MICROSCOPIC-ADD ON

## 2013-05-21 LAB — TROPONIN I: Troponin I: <0.3 ng/mL (ref <0.30)

## 2013-05-21 LAB — URINALYSIS, ROUTINE W REFLEX MICROSCOPIC
Ketones, ur: 15 mg/dL — AB
Leukocytes, UA: NEGATIVE
Nitrite: NEGATIVE
Protein, ur: NEGATIVE mg/dL
Urobilinogen, UA: 0.2 mg/dL (ref 0.0–1.0)

## 2013-05-21 LAB — LACTIC ACID, PLASMA: Lactic Acid, Venous: 2 mmol/L (ref 0.5–2.2)

## 2013-05-21 LAB — GLUCOSE, CAPILLARY: Glucose-Capillary: 126 mg/dL — ABNORMAL HIGH (ref 70–99)

## 2013-05-21 LAB — PROTIME-INR
INR: 1.07 (ref 0.00–1.49)
Prothrombin Time: 13.7 seconds (ref 11.6–15.2)

## 2013-05-21 LAB — FIBRINOGEN: Fibrinogen: 463 mg/dL (ref 204–475)

## 2013-05-21 MED ORDER — SODIUM CHLORIDE 0.9 % IV BOLUS (SEPSIS)
1000.0000 mL | INTRAVENOUS | Status: DC | PRN
  Administered 2013-05-21: 1000 mL via INTRAVENOUS

## 2013-05-21 MED ORDER — DEXTROSE 5 % IV SOLN: 500.0000 mg | Freq: Once | INTRAVENOUS | Status: DC

## 2013-05-21 MED ORDER — PIPERACILLIN-TAZOBACTAM 3.375 G IVPB
3.3750 g | Freq: Three times a day (TID) | INTRAVENOUS | Status: DC
  Filled 2013-05-21 (×3): qty 50

## 2013-05-21 MED ORDER — VANCOMYCIN HCL IN DEXTROSE 1-5 GM/200ML-% IV SOLN
1000.0000 mg | Freq: Two times a day (BID) | INTRAVENOUS | Status: DC
  Filled 2013-05-21: qty 200

## 2013-05-21 MED ORDER — SODIUM CHLORIDE 0.9 % IV BOLUS (SEPSIS)
1000.0000 mL | Freq: Once | INTRAVENOUS | Status: AC
  Administered 2013-05-21: 1000 mL via INTRAVENOUS

## 2013-05-21 MED ORDER — ACETAMINOPHEN 650 MG RE SUPP
650.0000 mg | Freq: Once | RECTAL | Status: AC
  Administered 2013-05-21: 650 mg via RECTAL
  Filled 2013-05-21: qty 1

## 2013-05-21 MED ORDER — PIPERACILLIN-TAZOBACTAM 3.375 G IVPB 30 MIN
3.3750 g | Freq: Once | INTRAVENOUS | Status: AC
  Administered 2013-05-21: 3.375 g via INTRAVENOUS

## 2013-05-21 MED ORDER — DEXTROSE 5 % IV SOLN: 1.0000 g | Freq: Once | INTRAVENOUS | Status: DC

## 2013-05-21 MED ORDER — VANCOMYCIN HCL IN DEXTROSE 1-5 GM/200ML-% IV SOLN
1000.0000 mg | Freq: Once | INTRAVENOUS | Status: AC
  Administered 2013-05-21: 1000 mg via INTRAVENOUS
  Filled 2013-05-21: qty 200

## 2013-05-21 NOTE — ED Provider Notes (Signed)
CSN: [id number]     Arrival date & time 05/27/2013  1931 History     First MD Initiated Contact with Patient 05/07/2013 2027     Chief Complaint  Patient presents with  . Code Stroke   (Consider location/radiation/quality/duration/timing/severity/associated sxs/prior Treatment) HPI Comments: Wife reports pt felt unwell today, was very weak/fatigued & was unable to stand.  Pt seen at code CVA at bridge.  He has also had some d/a.  No h/a, neck pain, CP, SOB, cough, ab pain, dysuria. Found to have fever upon arrival.  Patient is a 79 y.o. male presenting with extremity weakness. The history is provided by the spouse. No language interpreter was used.  Extremity Weakness This is a new problem. The current episode started 6 to 12 hours ago. The problem occurs constantly. The problem has not changed since onset.Pertinent negatives include no chest pain, no abdominal pain, no headaches and no shortness of breath. The symptoms are aggravated by standing. Nothing relieves the symptoms. He has tried nothing for the symptoms. The treatment provided no relief.    Past Medical History  Diagnosis Date  . Hypertension   . Dementia   . High cholesterol   . Erectile dysfunction   . Headache(784.0)   . Prostate enlargement     Benign  . Auditory acuity evaluation     Decreased  . Lumbosacral spondylosis   . Guillain-Barre syndrome     Following flu shot   Past Surgical History  Procedure Laterality Date  . Catatact surgery    . Cataract extraction Bilateral   . Amputation Right     Toe on the right foot  . Transurethral resection of prostate     Family History  Problem Relation Age of Onset  . Cancer Sister     Pancreatic  . Diabetes Sister   . Arthritis Brother     Degenerative  . Arthritis Brother     Degenerative   History  Substance Use Topics  . Smoking status: Never Smoker   . Smokeless tobacco: Not on file  . Alcohol Use: No    Review of Systems  Constitutional: Positive  for fever, activity change and fatigue. Negative for appetite change.  HENT: Negative for congestion, facial swelling, rhinorrhea and trouble swallowing.   Eyes: Negative for photophobia and pain.  Respiratory: Negative for cough, chest tightness and shortness of breath.   Cardiovascular: Negative for chest pain and leg swelling.  Gastrointestinal: Negative for nausea, vomiting, abdominal pain, diarrhea and constipation.  Endocrine: Negative for polydipsia and polyuria.  Genitourinary: Negative for dysuria, urgency, decreased urine volume and difficulty urinating.  Musculoskeletal: Positive for extremity weakness. Negative for back pain and gait problem.  Skin: Negative for color change, rash and wound.  Allergic/Immunologic: Negative for immunocompromised state.  Neurological: Negative for dizziness, facial asymmetry, speech difficulty, weakness, numbness and headaches.  Psychiatric/Behavioral: Negative for confusion, decreased concentration and agitation.    Allergies  Aricept and Exelon  Home Medications   Current Outpatient Rx  Name  Route  Sig  Dispense  Refill  . acetaminophen (TYLENOL) 500 MG tablet   Oral   Take 500 mg by mouth every 6 (six) hours as needed for pain.         Marland Kitchen aspirin EC 81 MG tablet   Oral   Take 81 mg by mouth daily.         . cholecalciferol (VITAMIN D) 1000 UNITS tablet   Oral   Take 1,000 Units by mouth daily.         Marland Kitchen  hydrochlorothiazide (MICROZIDE) 12.5 MG capsule   Oral   Take 12.5 mg by mouth daily.         . memantine (NAMENDA) 10 MG tablet   Oral   Take 1 tablet (10 mg total) by mouth 2 (two) times daily.   60 tablet   3   . QUEtiapine (SEROQUEL) 25 MG tablet   Oral   Take 3 tablets (75 mg total) by mouth at bedtime.   90 tablet   3   . rosuvastatin (CRESTOR) 5 MG tablet   Oral   Take 5 mg by mouth daily.         . tamsulosin (FLOMAX) 0.4 MG CAPS   Oral   Take 0.4 mg by mouth.          BP 125/57  Pulse 75   Temp(Src) 99.1 F (37.3 C) (Other (Comment))  Resp 18  Ht 6\' 1"  (1.854 m)  Wt 165 lb (74.844 kg)  BMI 21.77 kg/m2  SpO2 96% Physical Exam  Constitutional: He is oriented to person, place, and time. He appears well-developed and well-nourished. No distress.  HENT:  Head: Normocephalic and atraumatic.  Mouth/Throat: No oropharyngeal exudate.  Eyes: Pupils are equal, round, and reactive to light.  Neck: Normal range of motion. Neck supple.  Cardiovascular: Regular rhythm and normal heart sounds.  Tachycardia present.  Exam reveals no gallop and no friction rub.   No murmur heard. Pulmonary/Chest: Effort normal and breath sounds normal. No respiratory distress. He has no wheezes. He has no rales.  Abdominal: Soft. Bowel sounds are normal. He exhibits no distension and no mass. There is no tenderness. There is no rebound and no guarding.  Musculoskeletal: Normal range of motion. He exhibits no edema and no tenderness.  Neurological: He is alert and oriented to person, place, and time. He has normal strength. No cranial nerve deficit or sensory deficit. He exhibits normal muscle tone. Coordination normal. GCS eye subscore is 4. GCS verbal subscore is 4. GCS motor subscore is 6.  Skin: Skin is warm and dry.  Psychiatric: He has a normal mood and affect.    ED Course   Procedures (including critical care time)  Labs Reviewed  CBC - Abnormal; Notable for the following:    WBC 12.9 (*)    RBC 4.16 (*)    HCT 37.7 (*)    All other components within normal limits  DIFFERENTIAL - Abnormal; Notable for the following:    Neutrophils Relative % 87 (*)    Neutro Abs 11.2 (*)    Lymphocytes Relative 8 (*)    All other components within normal limits  COMPREHENSIVE METABOLIC PANEL - Abnormal; Notable for the following:    Glucose, Bld 117 (*)    BUN 26 (*)    GFR calc non Af Amer 63 (*)    GFR calc Af Amer 73 (*)    All other components within normal limits  GLUCOSE, CAPILLARY - Abnormal;  Notable for the following:    Glucose-Capillary 126 (*)    All other components within normal limits  URINALYSIS, ROUTINE W REFLEX MICROSCOPIC - Abnormal; Notable for the following:    Hgb urine dipstick MODERATE (*)    Ketones, ur 15 (*)    All other components within normal limits  CG4 I-STAT (LACTIC ACID) - Abnormal; Notable for the following:    Lactic Acid, Venous 2.23 (*)    All other components within normal limits  CG4 I-STAT (LACTIC ACID) - Abnormal; Notable  for the following:    Lactic Acid, Venous 3.03 (*)    All other components within normal limits  CULTURE, BLOOD (ROUTINE X 2)  CULTURE, BLOOD (ROUTINE X 2)  URINE CULTURE  CLOSTRIDIUM DIFFICILE BY PCR  LACTIC ACID, PLASMA  TROPONIN I  PROTIME-INR  FIBRINOGEN  URINE MICROSCOPIC-ADD ON  CORTISOL  POCT I-STAT TROPONIN I  TYPE AND SCREEN   Ct Head (brain) Wo Contrast  05/02/2013   *RADIOLOGY REPORT*  Clinical Data: Altered mental status, code stroke  CT HEAD WITHOUT CONTRAST  Technique:  Contiguous axial images were obtained from the base of the skull through the vertex without contrast.  Comparison: 12/06/2012; brain MRI - 02/02/2013  Findings:  Redemonstrated global atrophy with sulcal prominence and centralized volume loss with commiserate ex vacuo dilatation of the ventricular system.  Scattered periventricular hypodensities appear grossly unchanged and again favored to represent the sequela of microvascular ischemic disease.  Given background parenchymal abnormalities, there is no CT evidence of acute large territory infarct.  No intraparenchymal or extra-axial mass or hemorrhage. Unchanged size and configuration of the ventricles and basilar cisterns.  No midline shift.  Limited visualization of the paranasal sinuses and mastoid air cells are normal.  Regional soft tissues are normal.  No displaced calvarial fracture.  Post bilateral cataract surgery.  IMPRESSION: Stable findings of atrophy and microvascular ischemic disease  without acute intracranial process.  Above findings discussed with Dr. Fonnie Jarvis at 2003.   Original Report Authenticated By: Tacey Ruiz, MD   Dg Chest Portable 1 View  05/23/2013   *RADIOLOGY REPORT*  Clinical Data: Sepsis, fever  PORTABLE CHEST - 1 VIEW  Comparison: None.  Findings: Heart size and mediastinal contours are within normal range.  Mild interstitial prominence appears chronic and retrocardiac/left lung base opacities.  Aortic atherosclerosis.  No overt pleural effusion.  No pneumothorax.  Degenerative changes of the spine and shoulders without acute osseous finding.  IMPRESSION: Mild interstitial prominence appears chronic.  However, in a nonsmoker, would also include atypical/viral infection.  Mild left lower lung/retrocardiac opacity; atelectasis versus infiltrate.   Original Report Authenticated By: Jearld Lesch, M.D.   1. CAP (community acquired pneumonia)     Date: 05/14/2013  Rate: 92  Rhythm: normal sinus rhythm  QRS Axis: normal  Intervals: normal  ST/T Wave abnormalities: ST depressions inferiorly and ST depressions laterally  Conduction Disutrbances:none  Narrative Interpretation:   Old EKG Reviewed: changes noted, new TWI II, III, aVF, V3-V6    MDM   Pt is a 79 y.o. male with Pmhx as above who presents with BL leg weakness, and initially seen at bridge as code CVA. Pt taken to CT, had negative scan, then found to be tachypnea, tachycardic, febrile which prompted initiation of code sepsis.  2L IVF stated IV vanc/zosyns given for sepsis w/o source.  Wife reports pt became very weak/fatigued today, was unable to walk.  Pt denies h/a, neck pain, chest pain, SOB, ab pain, dysuria.  He has had some diarrhea since last night, and while in the department also began coughing.  W/U showed LA elevation, LLL/retrocardiac opacity, WBC elevation, BUN elevation.    [facility] consulted, will admit to their service.            Shanna Cisco, MD 05/22/13 [phone]

## 2013-05-21 NOTE — ED Notes (Addendum)
Family reports that pt has been unable to walk and disoriented since around 3pm.  States that pt ate lunch and took a nap and was normal after getting up from nap.  Pt normally alert and oriented and walks independently or with a walker.  Pt confused at this time but follows some repeated commands. Moving all extremities equally. Family reports LKW between 2:45-3pm. Code Stroke called and pt straight to CT.

## 2013-05-21 NOTE — Consult Note (Signed)
Neurology Consultation Reason for Consult: Altered Mental status Referring Physician: Toy Cookey  CC: Altered Mental Status   History is obtained from:Patient's wife, patient.   HPI: Jesus Shah is a 79 y.o. male with a history of lewy body dementia who presents with increasing confusion over the course of the day. She first noticed around 2:45 that he seemed weak in his legs and then later felt that he was getting more and more confused. She was afraid that he might be having a stroke and therefore brought him for evaluation where a code stroke was called.   Of note, he has been having significant diarrhea since last night. He denies headache.   LKW: 2:45 pm tpa given: no, outside of window   ROS: A 14 point ROS was asked of wife and is negative except as noted in the HPI.  Past Medical History  Diagnosis Date  . Hypertension   . Dementia   . High cholesterol   . Erectile dysfunction   . Headache(784.0)   . Prostate enlargement     Benign  . Auditory acuity evaluation     Decreased  . Lumbosacral spondylosis   . Guillain-Barre syndrome     Following flu shot    Family History: Mother dementia at age 85  Social History: Tob: denies.   Exam: Current vital signs: BP 154/63  Pulse 95  Temp(Src) 102.1 F (38.9 C) (Rectal)  Resp 22  SpO2 96% Vital signs in last 24 hours: Temp:  [99.3 F (37.4 C)-102.1 F (38.9 C)] 102.1 F (38.9 C) (08/24 2006) Pulse Rate:  [95-105] 95 (08/24 2006) Resp:  [20-22] 22 (08/24 2006) BP: (154-156)/(63-65) 154/63 mmHg (08/24 2006) SpO2:  [95 %-96 %] 96 % (08/24 2006)  General: in bed, appears slightly agitated.  CV: tachycardic Neck: supple Mental Status: Patient is awake, alert, oriented to person, place, not month or age.  He responds to questions with brief answers, and follows commands only intermittently.  He does not have any signs of neglect.  Cranial Nerves: II: Visual Fields are full to finger movement.  Pupils are equal, round, and reactive to light.  Discs are difficult to visualize. III,IV, VI: crosses midline in boht directions, but does not track reliably more distally.  V: Facial sensation is symmetric to temperature VII: Facial movement is symmetric.  VIII: hearing is intact to voice X: Uvula elevates symmetrically XI: Shoulder shrug is symmetric. XII: tongue is midline without atrophy or fasciculations.  Motor: Tone is increased symmetrically. Bulk is normal. 5/5 strength was present in all four extremities.  Sensory: Sensation is symmetric to light touch and temperature in the arms and legs. Deep Tendon Reflexes: 2+ and symmetric in the biceps and patellae.  Cerebellar: FNF with postural and intentional tremor bilaterally Gait: Not assessed due to acute nature of evaluation and multiple medical monitors in ED setting.  I have reviewed labs in epic and the results pertinent to this consultation are:   I have reviewed the images obtained:CT head - no acute findigns  Impression: 79yo M with altered mental status in the setting of fever, diarrhea and dementia. At this time, I have a very low suspicion for stroke and he is outside of any treatment window in any case. He does not have any headache or meningismus to make me suspect meningitis, and any infection can cause a worsening of mental status in dementia patients.   Recommendations: 1) No further stroke evaluation at this time.  2) Infectious work-up per internal  medicine. I have a very low suspicion for CNS infection at this time, unless no other suspected cause of fever is found then I do not feel that any further workup is needed.    Ritta Slot, MD [facility] [phone]  If 7pm- 7am, please page neurology on call at [phone].

## 2013-05-21 NOTE — Code Documentation (Signed)
Patient in normal state of health, awoke from nap around 1445 still in normal state of health. Just after ambulating after nap, patient became confused and had weakness in both legs. Patient arrived via POV to [facility] at [phone], code stroke called at 20 by triage RN, LKW at [phone], EDP exam at 2018, stroke team arrived at 1953, neurologist arrived at 1955, patient arrived in CT at 1950, labs drawn by RN at 2009, CT read by Dr. Amada Jupiter at 2006. Patient is confused at times, and has difficulty following commands. Will continue to monitor. Code sepsis activated for patient at 2023 due to fever, heart rate, and GI upset last evening. Will continue to monitor.

## 2013-05-21 NOTE — ED Notes (Addendum)
Pt flat on stretcher and taken straight to CT.  Dr. Salena Saner, RN, and [place], RRT met pt in CT.  Pt moving on stretcher and difficulty getting pt to lay flat.  CT tech remained at bedside with pt during CT scan. Pt very warm to touch.

## 2013-05-21 NOTE — ED Notes (Signed)
Level two code sepsis called to [facility] per Irving Burton

## 2013-05-21 NOTE — Progress Notes (Signed)
ANTIBIOTIC CONSULT NOTE - INITIAL  Pharmacy Consult for Vancomycin & zosyn Indication: rule out pneumonia  Allergies  Allergen Reactions  . Aricept [Donepezil Hcl] Diarrhea    Leg cramps.  . Exelon [Rivastigmine Tartrate] Dermatitis    Adhesive allergy    Patient Measurements: Height: 6\' 1"  (185.4 cm) Weight: 165 lb (74.844 kg) IBW/kg (Calculated) : 79.9  Vital Signs: Temp: 102.1 F (38.9 C) (08/24 2006) Temp src: Rectal (08/24 2006) BP: 150/64 mmHg (08/24 2020) Pulse Rate: 93 (08/24 2020) Intake/Output from previous day:   Intake/Output from this shift:    Labs: No results found for this basename: WBC, HGB, PLT, LABCREA, CREATININE,  in the last 72 hours The CrCl is unknown because both a height and weight (above a minimum accepted value) are required for this calculation. No results found for this basename: VANCOTROUGH, VANCOPEAK, VANCORANDOM, GENTTROUGH, GENTPEAK, GENTRANDOM, TOBRATROUGH, TOBRAPEAK, TOBRARND, AMIKACINPEAK, AMIKACINTROU, AMIKACIN,  in the last 72 hours   Microbiology: No results found for this or any previous visit (from the past 720 hour(s)).  Medical History: Past Medical History  Diagnosis Date  . Hypertension   . Dementia   . High cholesterol   . Erectile dysfunction   . Headache(784.0)   . Prostate enlargement     Benign  . Auditory acuity evaluation     Decreased  . Lumbosacral spondylosis   . Guillain-Barre syndrome     Following flu shot    Medications:   (Not in a hospital admission)  Assessment: 79 yo F admitted 05/07/2013 sepsis.  Pharmacy consulted to dose vancomycin and zosyn  ID: sepsis Renal: est CrCl 53, followup crcl  Goal of Therapy:  Vancomycin trough level 15-20 mcg/ml; Renal adjustment of antibiotics.   Plan:  Zosyn 3.375g x1 and Vancomcyin 1g given x 1 in ED Then: Zosyn 3.375g IV q8h infuse over 4h  Vancomcyin 750 mg IV q12h Follow up SCr, UOP, cultures, clinical course and adjust as clinically  indicated.   Thank you for allowing pharmacy to be a part of this patients care team.  Lovenia Kim Pharm.D., BCPS Clinical Pharmacist 05/24/2013 8:38 PM Pager: [phone] Phone: [phone]

## 2013-05-22 ENCOUNTER — Encounter (HOSPITAL_COMMUNITY): Payer: Self-pay | Admitting: Internal Medicine

## 2013-05-22 DIAGNOSIS — J189 Pneumonia, unspecified organism: Secondary | ICD-10-CM | POA: Diagnosis present

## 2013-05-22 DIAGNOSIS — R319 Hematuria, unspecified: Secondary | ICD-10-CM

## 2013-05-22 DIAGNOSIS — F0390 Unspecified dementia without behavioral disturbance: Secondary | ICD-10-CM | POA: Diagnosis present

## 2013-05-22 LAB — CBC
HCT: 35.7 % — ABNORMAL LOW (ref 39.0–52.0)
Hemoglobin: 12.5 g/dL — ABNORMAL LOW (ref 13.0–17.0)
MCH: 31.5 pg (ref 26.0–34.0)
MCHC: 35 g/dL (ref 30.0–36.0)
MCV: 89.9 fL (ref 78.0–100.0)

## 2013-05-22 LAB — BASIC METABOLIC PANEL
BUN: 17 mg/dL (ref 6–23)
CO2: 25 mEq/L (ref 19–32)
Chloride: 100 mEq/L (ref 96–112)
Glucose, Bld: 118 mg/dL — ABNORMAL HIGH (ref 70–99)
Potassium: 3.1 mEq/L — ABNORMAL LOW (ref 3.5–5.1)

## 2013-05-22 LAB — ABO/RH: ABO/RH(D): A POS

## 2013-05-22 LAB — LEGIONELLA ANTIGEN, URINE: Legionella Antigen, Urine: NEGATIVE

## 2013-05-22 LAB — CORTISOL: Cortisol, Plasma: 29.8 ug/dL

## 2013-05-22 MED ORDER — ACETAMINOPHEN 650 MG RE SUPP
650.0000 mg | Freq: Four times a day (QID) | RECTAL | Status: DC | PRN
  Administered 2013-05-24 – 2013-05-27 (×2): 650 mg via RECTAL
  Filled 2013-05-22 (×2): qty 1

## 2013-05-22 MED ORDER — ATORVASTATIN CALCIUM 10 MG PO TABS
10.0000 mg | ORAL_TABLET | Freq: Every day | ORAL | Status: DC
  Administered 2013-05-22 – 2013-05-23 (×2): 10 mg via ORAL
  Filled 2013-05-22 (×4): qty 1

## 2013-05-22 MED ORDER — SODIUM CHLORIDE 0.9 % IJ SOLN
3.0000 mL | Freq: Two times a day (BID) | INTRAMUSCULAR | Status: DC
  Administered 2013-05-22 – 2013-05-30 (×7): 3 mL via INTRAVENOUS

## 2013-05-22 MED ORDER — ONDANSETRON HCL 4 MG/2ML IJ SOLN: 4.0000 mg | Freq: Four times a day (QID) | INTRAMUSCULAR | Status: DC | PRN

## 2013-05-22 MED ORDER — QUETIAPINE FUMARATE 50 MG PO TABS
75.0000 mg | ORAL_TABLET | Freq: Every day | ORAL | Status: DC
  Administered 2013-05-22: 75 mg via ORAL
  Filled 2013-05-22 (×3): qty 1

## 2013-05-22 MED ORDER — LEVOFLOXACIN IN D5W 750 MG/150ML IV SOLN
750.0000 mg | INTRAVENOUS | Status: DC
  Administered 2013-05-22 – 2013-05-24 (×3): 750 mg via INTRAVENOUS
  Filled 2013-05-22 (×3): qty 150

## 2013-05-22 MED ORDER — DEXTROSE 5 % IV SOLN
500.0000 mg | INTRAVENOUS | Status: DC
  Administered 2013-05-22: 500 mg via INTRAVENOUS
  Filled 2013-05-22: qty 500

## 2013-05-22 MED ORDER — ONDANSETRON HCL 4 MG PO TABS: 4.0000 mg | ORAL_TABLET | Freq: Four times a day (QID) | ORAL | Status: DC | PRN

## 2013-05-22 MED ORDER — TAMSULOSIN HCL 0.4 MG PO CAPS
0.4000 mg | ORAL_CAPSULE | Freq: Every day | ORAL | Status: DC
  Administered 2013-05-22 – 2013-05-23 (×2): 0.4 mg via ORAL
  Filled 2013-05-22 (×8): qty 1

## 2013-05-22 MED ORDER — MEMANTINE HCL 10 MG PO TABS
10.0000 mg | ORAL_TABLET | Freq: Two times a day (BID) | ORAL | Status: DC
  Administered 2013-05-22 – 2013-05-27 (×5): 10 mg via ORAL
  Filled 2013-05-22 (×14): qty 1

## 2013-05-22 MED ORDER — DEXTROSE 5 % IV SOLN
1.0000 g | INTRAVENOUS | Status: DC
  Administered 2013-05-22: 1 g via INTRAVENOUS
  Filled 2013-05-22: qty 10

## 2013-05-22 MED ORDER — ACETAMINOPHEN 325 MG PO TABS
650.0000 mg | ORAL_TABLET | Freq: Four times a day (QID) | ORAL | Status: DC | PRN
  Administered 2013-05-27: 650 mg via ORAL
  Filled 2013-05-22: qty 2

## 2013-05-22 MED ORDER — SODIUM CHLORIDE 0.9 % IV SOLN
INTRAVENOUS | Status: AC
  Administered 2013-05-22 (×2): via INTRAVENOUS

## 2013-05-22 NOTE — Progress Notes (Signed)
Alert, oriented to self only, throwing legs over siderails, pulling at foley catheter(urine red/pink tinged), will not leave  Tele on,called Dr. Valentina Lucks about previous, see new orders, no sitter available at this time, bed alarm remains on, wife not at bedside at present.

## 2013-05-22 NOTE — Progress Notes (Addendum)
NEURO HOSPITALIST PROGRESS NOTE   SUBJECTIVE:                                                                                                                        Patient awake and follows simple commands.  Desires to go home.  Unable to hold complex conversation but unsure his baseline with dementia.  HE shows no focal or lateralizing symptoms.   OBJECTIVE:                                                                                                                           Vital signs in last 24 hours: Temp:  [98.3 F (36.8 C)-102.1 F (38.9 C)] 98.8 F (37.1 C) (08/25 0645) Pulse Rate:  [74-105] 75 (08/25 0645) Resp:  [15-22] 19 (08/25 0645) BP: (125-156)/(49-78) 143/57 mmHg (08/25 0645) SpO2:  [95 %-99 %] 95 % (08/25 0645) Weight:  [74.844 kg (165 lb)-76.522 kg (168 lb 11.2 oz)] 76.522 kg (168 lb 11.2 oz) (08/25 0237)  Intake/Output from previous day: 08/24 0701 - 08/25 0700 In: -  Out: 1500 [Urine:1500] Intake/Output this shift:   Nutritional status: Cardiac  Past Medical History  Diagnosis Date  . Hypertension   . Dementia   . High cholesterol   . Erectile dysfunction   . Headache(784.0)   . Prostate enlargement     Benign  . Auditory acuity evaluation     Decreased  . Lumbosacral spondylosis   . Guillain-Barre syndrome     Following flu shot   Mental Status: Alert, not oriented, focused on wanting to go home.  Speech fluent without evidence of aphasia.  Able to follow commands but on some commands must be prompted and was not fully paying attention to me.  Cranial Nerves: II: Visual fields grossly normal, pupils equal, round, reactive to light and accommodation III,IV, VI: ptosis not present, extra-ocular motions intact bilaterally V,VII: smile symmetric, facial light touch sensation normal bilaterally VIII: hearing normal bilaterally IX,X: gag reflex present XI: bilateral shoulder shrug XII: midline tongue  extension Motor: Right : Upper extremity   5/5    Left:     Upper extremity   5/5  Lower extremity   5/5     Lower extremity  5/5 Tone and bulk:normal tone throughout; no atrophy noted Sensory: Pinprick and light touch intact throughout, bilaterally Deep Tendon Reflexes:  Right: Upper Extremity   Left: Upper extremity   biceps (C-5 to C-6) 2/4   biceps (C-5 to C-6) 2/4 tricep (C7) 2/4    triceps (C7) 2/4 Brachioradialis (C6) 2/4  Brachioradialis (C6) 2/4  Lower Extremity Lower Extremity  quadriceps (L-2 to L-4) 2/4   quadriceps (L-2 to L-4) 2/4 Achilles (S1) 1/4   Achilles (S1) 1/4  Plantars: equivocal bilaterally Cerebellar: normal finger-to-nose,     Neurologic Exam:    Lab Results: No results found for this basename: cbc, bmp, coags, chol, tri, ldl, hga1c   Lipid Panel No results found for this basename: CHOL, TRIG, HDL, CHOLHDL, VLDL, LDLCALC,  in the last 72 hours  Studies/Results: Ct Head (brain) Wo Contrast  2013-06-05   *RADIOLOGY REPORT*  Clinical Data: Altered mental status, code stroke  CT HEAD WITHOUT CONTRAST  Technique:  Contiguous axial images were obtained from the base of the skull through the vertex without contrast.  Comparison: 12/06/2012; brain MRI - 02/02/2013  Findings:  Redemonstrated global atrophy with sulcal prominence and centralized volume loss with commiserate ex vacuo dilatation of the ventricular system.  Scattered periventricular hypodensities appear grossly unchanged and again favored to represent the sequela of microvascular ischemic disease.  Given background parenchymal abnormalities, there is no CT evidence of acute large territory infarct.  No intraparenchymal or extra-axial mass or hemorrhage. Unchanged size and configuration of the ventricles and basilar cisterns.  No midline shift.  Limited visualization of the paranasal sinuses and mastoid air cells are normal.  Regional soft tissues are normal.  No displaced calvarial fracture.  Post  bilateral cataract surgery.  IMPRESSION: Stable findings of atrophy and microvascular ischemic disease without acute intracranial process.  Above findings discussed with Dr. Fonnie Jarvis at 2003.   Original Report Authenticated By: Tacey Ruiz, MD   Dg Chest Portable 1 View  06/05/2013   *RADIOLOGY REPORT*  Clinical Data: Sepsis, fever  PORTABLE CHEST - 1 VIEW  Comparison: None.  Findings: Heart size and mediastinal contours are within normal range.  Mild interstitial prominence appears chronic and retrocardiac/left lung base opacities.  Aortic atherosclerosis.  No overt pleural effusion.  No pneumothorax.  Degenerative changes of the spine and shoulders without acute osseous finding.  IMPRESSION: Mild interstitial prominence appears chronic.  However, in a nonsmoker, would also include atypical/viral infection.  Mild left lower lung/retrocardiac opacity; atelectasis versus infiltrate.   Original Report Authenticated By: Jearld Lesch, M.D.    MEDICATIONS                                                                                                                        Scheduled: . atorvastatin  10 mg Oral q1800  . levofloxacin (LEVAQUIN) IV  750 mg Intravenous Q24H  . memantine  10 mg Oral BID  . QUEtiapine  75 mg Oral QHS  . sodium chloride  3 mL Intravenous Q12H  . tamsulosin  0.4 mg Oral QPC supper    ASSESSMENT/PLAN:                                                                                                           Impression: 79yo M with altered mental status in the setting of fever, diarrhea and dementia. Patient shows no focal or lateralizing symptoms. Neck remains supple.  Initial CT head negative for CVA. Likely worsening dementia symptoms in the setting of infections. No further neurology workup recommended.  Neurology will S/O.   Recommend:  1) Out patient follow up with neurologist after discharge.   Assessment and plan discussed with with attending physician and they are  in agreement.    Felicie Morn PA-C [facility] [phone]  05/22/2013, 9:43 AM

## 2013-05-22 NOTE — H&P (Signed)
[facility] History and Physical  Jesus Shah ZOX:[id number] DOB: 1933/02/13 DOA: 05/15/2013  Referring physician: ER physician. PCP: Lillia Mountain, MD  Specialists: Dr. Anne Hahn. Neurologist.  Chief Complaint: Difficulty walking and confusion.  HPI: Jesus Shah is a 79 y.o. male with recently diagnosed Lewis body dementia was brought to the ER patient was found to have increasing confusion with difficulty walking. Patient was initially brought as code stroke. CT head is negative for anything acute. On admission patient was found to be febrile. Chest x-ray was showing possible infiltrates. On further questioning patient was found to be having productive cough. Patient also had one episode of diarrhea yesterday. Neurologist on-call was consulted and code stroke was canceled and they have recommended no further neurological workup. Patient has been admitted for further management of pneumonia. In addition patient's Foley catheter shows hematuria.  Review of Systems: As presented in the history of presenting illness, rest negative.  Past Medical History  Diagnosis Date  . Hypertension   . Dementia   . High cholesterol   . Erectile dysfunction   . Headache(784.0)   . Prostate enlargement     Benign  . Auditory acuity evaluation     Decreased  . Lumbosacral spondylosis   . Guillain-Barre syndrome     Following flu shot   Past Surgical History  Procedure Laterality Date  . Catatact surgery    . Cataract extraction Bilateral   . Amputation Right     Toe on the right foot  . Transurethral resection of prostate     Social History:  reports that he has never smoked. He does not have any smokeless tobacco history on file. He reports that he does not drink alcohol or use illicit drugs. Home. where does patient live-- Can do ADLs. Can patient participate in ADLs?  Allergies  Allergen Reactions  . Aricept [Donepezil Hcl] Diarrhea    Leg cramps.  . Exelon  [Rivastigmine Tartrate] Dermatitis    Adhesive allergy    Family History  Problem Relation Age of Onset  . Cancer Sister     Pancreatic  . Diabetes Sister   . Arthritis Brother     Degenerative  . Arthritis Brother     Degenerative      Prior to Admission medications   Medication Sig Start Date End Date Taking? Authorizing Provider  acetaminophen (TYLENOL) 500 MG tablet Take 500 mg by mouth every 6 (six) hours as needed for pain.   Yes Historical Provider, MD  aspirin EC 81 MG tablet Take 81 mg by mouth daily.   Yes Historical Provider, MD  cholecalciferol (VITAMIN D) 1000 UNITS tablet Take 1,000 Units by mouth daily.   Yes Historical Provider, MD  hydrochlorothiazide (MICROZIDE) 12.5 MG capsule Take 12.5 mg by mouth daily.   Yes Historical Provider, MD  memantine (NAMENDA) 10 MG tablet Take 1 tablet (10 mg total) by mouth 2 (two) times daily. 02/26/13  Yes York Spaniel, MD  QUEtiapine (SEROQUEL) 25 MG tablet Take 3 tablets (75 mg total) by mouth at bedtime. 05/03/13  Yes York Spaniel, MD  rosuvastatin (CRESTOR) 5 MG tablet Take 5 mg by mouth daily.   Yes Historical Provider, MD  tamsulosin (FLOMAX) 0.4 MG CAPS Take 0.4 mg by mouth.   Yes Historical Provider, MD   Physical Exam: Filed Vitals:   05/23/2013 2230 05/07/2013 2245 05/10/2013 2300 05/09/2013 2315  BP: 141/65 130/57 132/56 125/57  Pulse: 83 82 79 75  Temp:    99.1 F (37.3  C)  TempSrc:   Other (Comment) Other (Comment)  Resp: 16 19 15 18   Height:      Weight:      SpO2: 96% 95% 96% 96%     General:  Well-developed and nourished.  Eyes: Anicteric no pallor.  ENT: No discharge from ears eyes nose mouth.  Neck: No mass felt.  Cardiovascular: S1-S2 heard.  Respiratory: No rhonchi or crepitations.  Abdomen: Soft nontender bowel sounds present.  Skin: No rash.  Musculoskeletal: No edema.  Psychiatric: Follows commands.  Neurologic: Alert awake oriented to his name. Moves all extremities.  Labs on  Admission:  Basic Metabolic Panel:  Recent Labs Lab 05/12/2013 2335  NA 135  K 3.7  CL 96  CO2 19  GLUCOSE 117*  BUN 26*  CREATININE 1.10  CALCIUM 9.6   Liver Function Tests:  Recent Labs Lab 05/20/2013 2335  AST 19  ALT 15  ALKPHOS 69  BILITOT 0.5  PROT 7.0  ALBUMIN 3.9   No results found for this basename: LIPASE, AMYLASE,  in the last 168 hours No results found for this basename: AMMONIA,  in the last 168 hours CBC:  Recent Labs Lab 05/26/2013 2335  WBC 12.9*  NEUTROABS 11.2*  HGB 13.5  HCT 37.7*  MCV 90.6  PLT 166   Cardiac Enzymes:  Recent Labs Lab 05/15/2013 2028  TROPONINI <0.30    BNP (last 3 results) No results found for this basename: PROBNP,  in the last 8760 hours CBG:  Recent Labs Lab 05/03/2013 2019  GLUCAP 126*    Radiological Exams on Admission: Ct Head (brain) Wo Contrast  05/22/2013   *RADIOLOGY REPORT*  Clinical Data: Altered mental status, code stroke  CT HEAD WITHOUT CONTRAST  Technique:  Contiguous axial images were obtained from the base of the skull through the vertex without contrast.  Comparison: 12/06/2012; brain MRI - 02/02/2013  Findings:  Redemonstrated global atrophy with sulcal prominence and centralized volume loss with commiserate ex vacuo dilatation of the ventricular system.  Scattered periventricular hypodensities appear grossly unchanged and again favored to represent the sequela of microvascular ischemic disease.  Given background parenchymal abnormalities, there is no CT evidence of acute large territory infarct.  No intraparenchymal or extra-axial mass or hemorrhage. Unchanged size and configuration of the ventricles and basilar cisterns.  No midline shift.  Limited visualization of the paranasal sinuses and mastoid air cells are normal.  Regional soft tissues are normal.  No displaced calvarial fracture.  Post bilateral cataract surgery.  IMPRESSION: Stable findings of atrophy and microvascular ischemic disease without acute  intracranial process.  Above findings discussed with Dr. Fonnie Jarvis at 2003.   Original Report Authenticated By: Tacey Ruiz, MD   Dg Chest Portable 1 View  04/29/2013   *RADIOLOGY REPORT*  Clinical Data: Sepsis, fever  PORTABLE CHEST - 1 VIEW  Comparison: None.  Findings: Heart size and mediastinal contours are within normal range.  Mild interstitial prominence appears chronic and retrocardiac/left lung base opacities.  Aortic atherosclerosis.  No overt pleural effusion.  No pneumothorax.  Degenerative changes of the spine and shoulders without acute osseous finding.  IMPRESSION: Mild interstitial prominence appears chronic.  However, in a nonsmoker, would also include atypical/viral infection.  Mild left lower lung/retrocardiac opacity; atelectasis versus infiltrate.   Original Report Authenticated By: Jearld Lesch, M.D.     Assessment/Plan Principal Problem:   Community acquired pneumonia Active Problems:   Altered mental status   Dementia   Hematuria   1. Community-acquired  pneumonia - patient has been placed on ceftriaxone and Zithromax. Check urine Legionella and strep antigen. Initially patient was thought to be having sepsis and blood cultures were sent. Follow blood cultures. Since patient also has hematuria check urine cultures and stool for C. difficile since patient had diarrhea last night. 2. Dementia with Magnus Ivan bodies - followup neurology.    Code Status: Full code.  Family Communication: Patient's wife at the bedside.  Disposition Plan: Admit to inpatient of Dr. Kirby Funk.    Jesus Shah N. [facility] Pager [phone].  If 7PM-7AM, please contact night-coverage [email or web address] Password [facility] 05/22/2013, 1:41 AM

## 2013-05-22 NOTE — Progress Notes (Signed)
Pt remains incontinent of urine. Urine remains pink tinged.

## 2013-05-22 NOTE — Progress Notes (Signed)
Pt moved to camera room, bed alarm on, wife at bedside.

## 2013-05-23 ENCOUNTER — Telehealth: Payer: Self-pay | Admitting: Neurology

## 2013-05-23 LAB — CBC WITH DIFFERENTIAL/PLATELET
Basophils Absolute: 0 10*3/uL (ref 0.0–0.1)
Basophils Relative: 0 % (ref 0–1)
Eosinophils Absolute: 0.1 10*3/uL (ref 0.0–0.7)
Lymphs Abs: 1.2 10*3/uL (ref 0.7–4.0)
MCH: 31.9 pg (ref 26.0–34.0)
MCHC: 36 g/dL (ref 30.0–36.0)
Neutrophils Relative %: 81 % — ABNORMAL HIGH (ref 43–77)
Platelets: 148 10*3/uL — ABNORMAL LOW (ref 150–400)
RBC: 3.95 MIL/uL — ABNORMAL LOW (ref 4.22–5.81)
RDW: 12.5 % (ref 11.5–15.5)

## 2013-05-23 LAB — BASIC METABOLIC PANEL
Calcium: 9.1 mg/dL (ref 8.4–10.5)
Creatinine, Ser: 0.89 mg/dL (ref 0.50–1.35)
GFR calc non Af Amer: 80 mL/min — ABNORMAL LOW (ref >90)
Glucose, Bld: 118 mg/dL — ABNORMAL HIGH (ref 70–99)
Sodium: 136 mEq/L (ref 135–145)

## 2013-05-23 LAB — TROPONIN I: Troponin I: <0.3 ng/mL (ref <0.30)

## 2013-05-23 LAB — CLOSTRIDIUM DIFFICILE BY PCR: Toxigenic C. Difficile by PCR: NEGATIVE

## 2013-05-23 MED ORDER — LORAZEPAM 2 MG/ML IJ SOLN
0.5000 mg | Freq: Three times a day (TID) | INTRAMUSCULAR | Status: DC | PRN
  Administered 2013-05-24 – 2013-05-25 (×3): 0.5 mg via INTRAVENOUS
  Filled 2013-05-23 (×3): qty 1

## 2013-05-23 MED ORDER — POTASSIUM CHLORIDE CRYS ER 20 MEQ PO TBCR
20.0000 meq | EXTENDED_RELEASE_TABLET | Freq: Two times a day (BID) | ORAL | Status: DC
  Administered 2013-05-23 (×2): 20 meq via ORAL
  Filled 2013-05-23 (×6): qty 1

## 2013-05-23 MED ORDER — QUETIAPINE FUMARATE 50 MG PO TABS
50.0000 mg | ORAL_TABLET | Freq: Every day | ORAL | Status: DC
  Administered 2013-05-23: 50 mg via ORAL
  Filled 2013-05-23 (×2): qty 1

## 2013-05-23 MED ORDER — WHITE PETROLATUM GEL
Status: AC
  Filled 2013-05-23: qty 5

## 2013-05-23 NOTE — Progress Notes (Signed)
Alert, oriented to self only this shift, condom cath on(urine remains pink tinged), restless, attempted several times this shift to get OOB, sitter at bedside, bed alarm on

## 2013-05-23 NOTE — Evaluation (Signed)
Physical Therapy Evaluation Patient Details Name: Jesus Shah MRN: [id number] DOB: 06/20/1933 Today's Date: 05/23/2013 Time: 4098-1191 PT Time Calculation (min): 13 min  PT Assessment / Plan / Recommendation History of Present Illness  Jesus Shah is a 79 y.o. male with recently diagnosed Lewis body dementia was brought to the ER patient was found to have increasing confusion with difficulty walking. CT head is negative for anything acute. Pt with PNA.  Clinical Impression  Pt admitted with above. Pt currently with functional limitations due to the deficits listed below (see PT Problem List).  Pt will benefit from skilled PT to increase their independence and safety with mobility to allow discharge to the venue listed below.       PT Assessment  Patient needs continued PT services    Follow Up Recommendations  SNF    Does the patient have the potential to tolerate intense rehabilitation      Barriers to Discharge        Equipment Recommendations  None recommended by PT    Recommendations for Other Services     Frequency Min 3X/week    Precautions / Restrictions Precautions Precautions: Fall   Pertinent Vitals/Pain No signs of pain.      Mobility  Bed Mobility Bed Mobility: Supine to Sit;Sitting - Scoot to Edge of Bed;Sit to Supine Supine to Sit: 3: Mod assist;HOB elevated Sitting - Scoot to Edge of Bed: 2: Max assist Sit to Supine: 3: Mod assist;HOB flat Details for Bed Mobility Assistance: Assist to bring trunk up and hips to EOB.  Pt leaning posteriorly. Verbal/tactile cues to initiate and stay on task. Transfers Transfers: Sit to Stand;Stand to Sit Details for Transfer Assistance: Attempted but unable with total assist.  Pt with rt leg jerking and pt leaning heavily posteriorly.    Exercises     PT Diagnosis: Difficulty walking;Generalized weakness;Altered mental status  PT Problem List: Decreased strength;Decreased activity tolerance;Decreased  balance;Decreased mobility;Decreased knowledge of use of DME;Decreased cognition;Decreased safety awareness;Decreased knowledge of precautions PT Treatment Interventions: DME instruction;Gait training;Functional mobility training;Therapeutic activities;Therapeutic exercise;Patient/family education;Cognitive remediation;Balance training     PT Goals(Current goals can be found in the care plan section) Acute Rehab PT Goals Patient Stated Goal: Pt unable to state. PT Goal Formulation: With family Time For Goal Achievement: 05/30/13 Potential to Achieve Goals: Fair  Visit Information  Last PT Received On: 05/23/13 Assistance Needed: +2 History of Present Illness: Jesus Shah is a 79 y.o. male with recently diagnosed Lewis body dementia was brought to the ER patient was found to have increasing confusion with difficulty walking. CT head is negative for anything acute. Pt with PNA.       Prior Functioning  Home Living Family/patient expects to be discharged to:: Private residence Living Arrangements: Spouse/significant other Available Help at Discharge: Family;Available 24 hours/day Type of Home: House Home Access: Stairs to enter [employer] of Steps: 3-5 Entrance Stairs-Rails: Right Home Layout: One level Home Equipment: Walker - 2 wheels Prior Function Level of Independence: Independent with assistive device(s);Needs assistance Gait / Transfers Assistance Needed: Amb mod I with some use of walker.    Cognition  Cognition Arousal/Alertness: Lethargic Behavior During Therapy: Restless Overall Cognitive Status: Impaired/Different from baseline Area of Impairment: Attention;Memory;Following commands;Safety/judgement;Problem solving Current Attention Level: Focused Memory: Decreased recall of precautions;Decreased short-term memory Following Commands: Follows one step commands inconsistently Safety/Judgement: Decreased awareness of safety;Decreased awareness of  deficits Problem Solving: Slow processing;Decreased initiation;Difficulty sequencing;Requires verbal cues;Requires tactile cues    Extremity/Trunk Assessment Lower Extremity  Assessment Lower Extremity Assessment: Difficult to assess due to impaired cognition   Balance Balance Balance Assessed: Yes Static Standing Balance Static Standing - Balance Support: Bilateral upper extremity supported Static Standing - Level of Assistance: 5: Stand by assistance;2: Max assist;4: Min assist;3: Mod assist Static Standing - Comment/# of Minutes: Assist needed varied with pt leaning heavily posteriorly at times.  End of Session PT - End of Session Equipment Utilized During Treatment: Gait belt Activity Tolerance: Other (comment) (limited by cognition.) Patient left: in bed;with family/visitor present;with nursing/sitter in room Nurse Communication: Mobility status;Need for lift equipment  GP     Jesus Shah 05/23/2013, 4:15 PM  [facility] PT [phone]

## 2013-05-23 NOTE — Progress Notes (Signed)
Subjective: Confusion over night  Objective: Vital signs in last 24 hours: Temp:  [98 F (36.7 C)-98.8 F (37.1 C)] 98.1 F (36.7 C) (08/26 0500) Pulse Rate:  [73-106] 97 (08/26 0500) Resp:  [16-20] 20 (08/26 0500) BP: (143-181)/(53-79) 152/75 mmHg (08/26 0500) SpO2:  [95 %-98 %] 95 % (08/26 0500) Weight change:     Intake/Output from previous day: 08/25 0701 - 08/26 0700 In: 363 [P.O.:360; I.V.:3] Out: 504 [Urine:504] Intake/Output this shift: Total I/O In: 3 [I.V.:3] Out: 500 [Urine:500]  General appearance: alert and cooperative Resp: clear to auscultation bilaterally Cardio: regular rate and rhythm, S1, S2 normal, no murmur, click, rub or gallop GI: soft, non-tender; bowel sounds normal; no masses,  no organomegaly Extremities: extremities normal, atraumatic, no cyanosis or edema  Lab Results:  Recent Labs  05/22/13 0500 05/23/13 0450  WBC 11.4* 10.1  HGB 12.5* 12.6*  HCT 35.7* 35.0*  PLT 143* 148*   BMET  Recent Labs  05/22/13 0500 05/23/13 0450  NA 134* 136  K 3.1* 3.4*  CL 100 103  CO2 25 22  GLUCOSE 118* 118*  BUN 17 11  CREATININE 0.95 0.89  CALCIUM 8.9 9.1    Studies/Results: Ct Head (brain) Wo Contrast  05/25/2013   *RADIOLOGY REPORT*  Clinical Data: Altered mental status, code stroke  CT HEAD WITHOUT CONTRAST  Technique:  Contiguous axial images were obtained from the base of the skull through the vertex without contrast.  Comparison: 12/06/2012; brain MRI - 02/02/2013  Findings:  Redemonstrated global atrophy with sulcal prominence and centralized volume loss with commiserate ex vacuo dilatation of the ventricular system.  Scattered periventricular hypodensities appear grossly unchanged and again favored to represent the sequela of microvascular ischemic disease.  Given background parenchymal abnormalities, there is no CT evidence of acute large territory infarct.  No intraparenchymal or extra-axial mass or hemorrhage. Unchanged size and  configuration of the ventricles and basilar cisterns.  No midline shift.  Limited visualization of the paranasal sinuses and mastoid air cells are normal.  Regional soft tissues are normal.  No displaced calvarial fracture.  Post bilateral cataract surgery.  IMPRESSION: Stable findings of atrophy and microvascular ischemic disease without acute intracranial process.  Above findings discussed with Dr. Fonnie Jarvis at 2003.   Original Report Authenticated By: Tacey Ruiz, MD   Dg Chest Portable 1 View  05/09/2013   *RADIOLOGY REPORT*  Clinical Data: Sepsis, fever  PORTABLE CHEST - 1 VIEW  Comparison: None.  Findings: Heart size and mediastinal contours are within normal range.  Mild interstitial prominence appears chronic and retrocardiac/left lung base opacities.  Aortic atherosclerosis.  No overt pleural effusion.  No pneumothorax.  Degenerative changes of the spine and shoulders without acute osseous finding.  IMPRESSION: Mild interstitial prominence appears chronic.  However, in a nonsmoker, would also include atypical/viral infection.  Mild left lower lung/retrocardiac opacity; atelectasis versus infiltrate.   Original Report Authenticated By: Jearld Lesch, M.D.    Medications: I have reviewed the patient's current medications.  Assessment/Plan: Principal Problem:   Community acquired pneumonia day 2 iv levaquin, continue Active Problems:   Dementia chronic,on namenda, was exelon stopped?   Hypokalemia, replete   Have PT see to determine disposition    LOS: 2 days   [facility] 05/23/2013, 6:26 AM

## 2013-05-24 ENCOUNTER — Inpatient Hospital Stay (HOSPITAL_COMMUNITY): Payer: Medicare Other

## 2013-05-24 LAB — URINALYSIS, ROUTINE W REFLEX MICROSCOPIC
Glucose, UA: NEGATIVE mg/dL
Specific Gravity, Urine: 1.02 (ref 1.005–1.030)
Urobilinogen, UA: 0.2 mg/dL (ref 0.0–1.0)

## 2013-05-24 LAB — BASIC METABOLIC PANEL
Calcium: 9.1 mg/dL (ref 8.4–10.5)
Creatinine, Ser: 1 mg/dL (ref 0.50–1.35)
GFR calc Af Amer: 82 mL/min — ABNORMAL LOW (ref >90)
GFR calc non Af Amer: 70 mL/min — ABNORMAL LOW (ref >90)
Sodium: 135 mEq/L (ref 135–145)

## 2013-05-24 LAB — URINE MICROSCOPIC-ADD ON

## 2013-05-24 LAB — CBC
Platelets: 156 10*3/uL (ref 150–400)
RBC: 3.93 MIL/uL — ABNORMAL LOW (ref 4.22–5.81)
RDW: 12.7 % (ref 11.5–15.5)
WBC: 10 10*3/uL (ref 4.0–10.5)

## 2013-05-24 LAB — COMPREHENSIVE METABOLIC PANEL
ALT: 15 U/L (ref 0–53)
AST: 28 U/L (ref 0–37)
Albumin: 2.8 g/dL — ABNORMAL LOW (ref 3.5–5.2)
CO2: 21 mEq/L (ref 19–32)
Chloride: 103 mEq/L (ref 96–112)
GFR calc non Af Amer: 54 mL/min — ABNORMAL LOW (ref >90)
Sodium: 136 mEq/L (ref 135–145)
Total Bilirubin: 0.8 mg/dL (ref 0.3–1.2)

## 2013-05-24 LAB — CK: Total CK: 214 U/L (ref 7–232)

## 2013-05-24 MED ORDER — DEXTROSE-NACL 5-0.45 % IV SOLN
INTRAVENOUS | Status: DC
  Administered 2013-05-24 – 2013-05-25 (×3): via INTRAVENOUS
  Filled 2013-05-24: qty 1000

## 2013-05-24 MED ORDER — VANCOMYCIN HCL IN DEXTROSE 1-5 GM/200ML-% IV SOLN
1000.0000 mg | Freq: Two times a day (BID) | INTRAVENOUS | Status: DC
  Administered 2013-05-24 – 2013-05-25 (×2): 1000 mg via INTRAVENOUS
  Filled 2013-05-24 (×3): qty 200

## 2013-05-24 MED ORDER — PIPERACILLIN-TAZOBACTAM 3.375 G IVPB
3.3750 g | Freq: Three times a day (TID) | INTRAVENOUS | Status: DC
  Administered 2013-05-24 – 2013-05-30 (×19): 3.375 g via INTRAVENOUS
  Filled 2013-05-24 (×22): qty 50

## 2013-05-24 MED ORDER — ALBUTEROL SULFATE (5 MG/ML) 0.5% IN NEBU
2.5000 mg | INHALATION_SOLUTION | Freq: Four times a day (QID) | RESPIRATORY_TRACT | Status: DC
  Administered 2013-05-24 – 2013-05-25 (×5): 2.5 mg via RESPIRATORY_TRACT
  Filled 2013-05-24 (×5): qty 0.5

## 2013-05-24 NOTE — Progress Notes (Signed)
PT Cancellation Note  Patient Details Name: Jesus Shah MRN: [id number] DOB: 12-26-1932   Cancelled Treatment:    Reason Eval/Treat Not Completed: Medical issues which prohibited therapy. Per RN request will hold PT and f/u tomorrow.  [name] PT, DPT Pager: [phone]    [name] 05/24/2013, 1:45 PM

## 2013-05-24 NOTE — Progress Notes (Signed)
Patient ID: Jesus Shah, male   DOB: 05-15-1933, 79 y.o.   MRN: [id number] Called by nurse. The patient has had a decline in mental status today. He's been sleeping most of the day, arousable but lethargic. He had a temperature 100.9. His urine is very dark. Chest x-ray from this morning shows worsening left lower lobe infiltrate and possible right lower lobe infiltrate. He has been switched from Levaquin to Zosyn. Add vancomycin. Check complete metabolic profile, CBC, urinalysis, CPK. Hold Seroquel. Start IV fluids. Discussed with wife. He may have aspirated, prognosis guarded

## 2013-05-24 NOTE — Progress Notes (Signed)
ANTIBIOTIC CONSULT NOTE - INITIAL  Pharmacy Consult for vancomycin Indication: rule out pneumonia  Allergies  Allergen Reactions  . Aricept [Donepezil Hcl] Diarrhea    Leg cramps.  . Exelon [Rivastigmine Tartrate] Dermatitis    Adhesive allergy    Patient Measurements: Height: 6\' 1"  (185.4 cm) Weight: 168 lb 11.2 oz (76.522 kg) IBW/kg (Calculated) : 79.9   Vital Signs: Temp: 100.4 F (38 C) (08/27 1406) Temp src: Axillary (08/27 1406) BP: 119/50 mmHg (08/27 1258) Pulse Rate: 99 (08/27 1258) Intake/Output from previous day: 08/26 0701 - 08/27 0700 In: 243 [P.O.:240; I.V.:3] Out: 304 [Urine:301; Stool:3] Intake/Output from this shift: Total I/O In: -  Out: 150 [Urine:150]  Labs:  Recent Labs  05/02/2013 2335 05/22/13 0500 05/23/13 0450 05/24/13 0703  WBC 12.9* 11.4* 10.1  --   HGB 13.5 12.5* 12.6*  --   PLT 166 143* 148*  --   CREATININE 1.10 0.95 0.89 1.00   Estimated Creatinine Clearance: 66.9 ml/min (by C-G formula based on Cr of 1). No results found for this basename: VANCOTROUGH, VANCOPEAK, VANCORANDOM, GENTTROUGH, GENTPEAK, GENTRANDOM, TOBRATROUGH, TOBRAPEAK, TOBRARND, AMIKACINPEAK, AMIKACINTROU, AMIKACIN,  in the last 72 hours   Microbiology: Recent Results (from the past 720 hour(s))  CULTURE, BLOOD (ROUTINE X 2)     Status: None   Collection Time    05/06/2013  8:09 PM      Result Value Range Status   Specimen Description BLOOD RIGHT FOREARM   Final   Special Requests BOTTLES DRAWN AEROBIC ONLY 10CC   Final   Culture  Setup Time     Final   Value: 05/22/2013 01:21     Performed at [facility]   Culture     Final   Value:        BLOOD CULTURE RECEIVED NO GROWTH TO DATE CULTURE WILL BE HELD FOR 5 DAYS BEFORE ISSUING A FINAL NEGATIVE REPORT     Performed at [facility]   Report Status PENDING   Incomplete  URINE CULTURE     Status: None   Collection Time    05/23/2013  8:38 PM      Result Value Range Status   Specimen Description  URINE, RANDOM   Final   Special Requests NONE   Final   Culture  Setup Time     Final   Value: 05/22/2013 01:27     Performed at [facility] Count     Final   Value: NO GROWTH     Performed at [facility]   Culture     Final   Value: NO GROWTH     Performed at [facility]   Report Status 05/23/2013 FINAL   Final  CULTURE, BLOOD (ROUTINE X 2)     Status: None   Collection Time    04/29/2013  8:55 PM      Result Value Range Status   Specimen Description BLOOD LEFT ARM   Final   Special Requests BOTTLES DRAWN AEROBIC AND ANAEROBIC 10CC EA   Final   Culture  Setup Time     Final   Value: 05/22/2013 01:21     Performed at [facility]   Culture     Final   Value:        BLOOD CULTURE RECEIVED NO GROWTH TO DATE CULTURE WILL BE HELD FOR 5 DAYS BEFORE ISSUING A FINAL NEGATIVE REPORT     Performed at [facility]  Report Status PENDING   Incomplete  CLOSTRIDIUM DIFFICILE BY PCR     Status: None   Collection Time    05/23/13 10:00 AM      Result Value Range Status   C difficile by pcr NEGATIVE  NEGATIVE Final    Medical History: Past Medical History  Diagnosis Date  . Hypertension   . Dementia   . High cholesterol   . Erectile dysfunction   . Headache(784.0)   . Prostate enlargement     Benign  . Auditory acuity evaluation     Decreased  . Lumbosacral spondylosis   . Guillain-Barre syndrome     Following flu shot    Medications:  Scheduled:  . albuterol  2.5 mg Nebulization QID  . atorvastatin  10 mg Oral q1800  . memantine  10 mg Oral BID  . piperacillin-tazobactam (ZOSYN)  IV  3.375 g Intravenous Q8H  . potassium chloride  20 mEq Oral BID  . sodium chloride  3 mL Intravenous Q12H  . tamsulosin  0.4 mg Oral QPC supper   Assessment: 79 yo man to start vancomycin for suspected PNA.  His SrCr is 1.0. Tmax 100.9 Cultures are neg to date.    Goal of Therapy:  Vancomycin trough level 15-20 mcg/ml  Plan:  Vancomycin  1000 mg IV q12 hours. F/u renal function, cultures and clinical progress.  Talbert Cage Poteet 05/24/2013,2:07 PM

## 2013-05-24 NOTE — Clinical Documentation Improvement (Signed)
THIS DOCUMENT IS NOT A PERMANENT PART OF THE MEDICAL RECORD  Please update your documentation with the medical record to reflect your response to this query. If you need help knowing how to do this please call [phone].  05/24/13  Dear Dr. Doristine Locks Marton Redwood,  In a better effort to capture your patient's severity of illness, reflect appropriate length of stay and utilization of resources, a review of the patient medical record has revealed sepsis in ED progress notes; sepsis documentation drops off and only Pneumonia is documented.   Presents with weakness, tachycardic (90-105), tachypneic, febrile (102.2)  WBC = 12.9 on admit  CVA ruled out  Code Sepsis called  Vancomycin and Zosyn initiated    Based on your clinical judgment, please clarify and document in a progress note if: Sepsis ruled in              Sepsis ruled out  In responding to this query please exercise your independent judgment.  The fact that a query is asked, does not imply that any particular answer is desired or expected.    Reviewed: additional documentation in the medical record  Thank Lucilla Edin  Clinical Documentation Specialist: [phone] Health Information Management [place]

## 2013-05-24 NOTE — Telephone Encounter (Signed)
Called and spoke to patient's wife. Patient wife wants Dr.Willis to know that patient is still in hospital with pneumonia. Patient is sleeping a lot on call doctor did come see them.

## 2013-05-24 NOTE — Progress Notes (Signed)
Subjective: Had better night, slept, increased congested cough  Objective: Vital signs in last 24 hours: Temp:  [98.1 F (36.7 C)-99.4 F (37.4 C)] 98.6 F (37 C) (08/27 0529) Pulse Rate:  [88-95] 88 (08/27 0529) Resp:  [18] 18 (08/27 0529) BP: (119-136)/(49-65) 119/62 mmHg (08/27 0529) SpO2:  [96 %-97 %] 96 % (08/27 0529) Weight change:  Last BM Date: 05/23/13  Intake/Output from previous day: 08/26 0701 - 08/27 0700 In: 243 [P.O.:240; I.V.:3] Out: 304 [Urine:301; Stool:3] Intake/Output this shift: Total I/O In: 3 [I.V.:3] Out: 300 [Urine:300]  General appearance: sleeping Resp: rhonchi bilaterally Cardio: regular rate and rhythm, S1, S2 normal, no murmur, click, rub or gallop Extremities: extremities normal, atraumatic, no cyanosis or edema  Lab Results:  Recent Labs  05/22/13 0500 05/23/13 0450  WBC 11.4* 10.1  HGB 12.5* 12.6*  HCT 35.7* 35.0*  PLT 143* 148*   BMET  Recent Labs  05/22/13 0500 05/23/13 0450  NA 134* 136  K 3.1* 3.4*  CL 100 103  CO2 25 22  GLUCOSE 118* 118*  BUN 17 11  CREATININE 0.95 0.89  CALCIUM 8.9 9.1    Studies/Results: No results found.  Medications: I have reviewed the patient's current medications.  Assessment/Plan: Principal Problem:  Community acquired pneumonia day 2 iv levaquin, pharmacy note re: interaction between seroquel and levaquin noted.  D/C levaquin and start zosyn.  More rhonchi today, recheck CXR and start albuterol nebs. Active Problems:  Dementia chronic,on namenda, exelon patch had been stopped.  Seroquel dose decreased because of oversedation in am. Hypokalemia, replete  Disposition, PT recommending NHP.  Discuss with wife.    LOS: 3 days   Siddhanth Denk JOSEPH 05/24/2013, 6:22 AM

## 2013-05-24 NOTE — Progress Notes (Signed)
Patient not alert and oriented enough to take evening pills. Patient very fidgety despite repositioning. Condom catheter came off due to patient constantly moving around in bed. Will leave it off until he calms down enough for [place] to put it on again. When turning patient he begins to hit, he tried to bite my hand and gets very irritable. 0.5mg  IV Ativan given per PRN order. Will continue to monitor patient at this time.

## 2013-05-24 NOTE — Telephone Encounter (Signed)
I called the wife and I talked with her. The patient is in the hospital with pneumonia. The Seroquel was discontinued because of sedation in the hospital. The patient is now become somewhat agitated. Patient is still running fevers.

## 2013-05-25 LAB — BASIC METABOLIC PANEL
BUN: 21 mg/dL (ref 6–23)
CO2: 25 mEq/L (ref 19–32)
Chloride: 104 mEq/L (ref 96–112)
Glucose, Bld: 129 mg/dL — ABNORMAL HIGH (ref 70–99)
Potassium: 3.3 mEq/L — ABNORMAL LOW (ref 3.5–5.1)
Sodium: 138 mEq/L (ref 135–145)

## 2013-05-25 LAB — CBC
HCT: 36.1 % — ABNORMAL LOW (ref 39.0–52.0)
Hemoglobin: 12.6 g/dL — ABNORMAL LOW (ref 13.0–17.0)
MCH: 31.3 pg (ref 26.0–34.0)
MCHC: 34.9 g/dL (ref 30.0–36.0)
MCV: 89.8 fL (ref 78.0–100.0)
RBC: 4.02 MIL/uL — ABNORMAL LOW (ref 4.22–5.81)

## 2013-05-25 LAB — URINE CULTURE

## 2013-05-25 MED ORDER — ALBUTEROL SULFATE (5 MG/ML) 0.5% IN NEBU
2.5000 mg | INHALATION_SOLUTION | Freq: Three times a day (TID) | RESPIRATORY_TRACT | Status: DC
  Administered 2013-05-25 – 2013-05-31 (×18): 2.5 mg via RESPIRATORY_TRACT
  Filled 2013-05-25 (×20): qty 0.5

## 2013-05-25 MED ORDER — VANCOMYCIN HCL IN DEXTROSE 750-5 MG/150ML-% IV SOLN
750.0000 mg | Freq: Two times a day (BID) | INTRAVENOUS | Status: DC
  Administered 2013-05-25 – 2013-05-26 (×2): 750 mg via INTRAVENOUS
  Filled 2013-05-25 (×3): qty 150

## 2013-05-25 MED ORDER — IPRATROPIUM BROMIDE 0.02 % IN SOLN
0.5000 mg | Freq: Three times a day (TID) | RESPIRATORY_TRACT | Status: DC
  Administered 2013-05-25 – 2013-05-31 (×18): 0.5 mg via RESPIRATORY_TRACT
  Filled 2013-05-25 (×20): qty 2.5

## 2013-05-25 MED ORDER — POTASSIUM CHLORIDE CRYS ER 20 MEQ PO TBCR
20.0000 meq | EXTENDED_RELEASE_TABLET | Freq: Three times a day (TID) | ORAL | Status: DC
  Filled 2013-05-25 (×4): qty 1

## 2013-05-25 NOTE — Progress Notes (Signed)
Physical Therapy Treatment Patient Details Name: Jesus Shah MRN: [id number] DOB: Jan 31, 1933 Today's Date: 05/25/2013 Time: 1011-1050 PT Time Calculation (min): 39 min  PT Assessment / Plan / Recommendation  History of Present Illness Jesus Shah is a 79 y.o. male with recently diagnosed Lewis body dementia was brought to the ER patient was found to have increasing confusion with difficulty walking. CT head is negative for anything acute. Pt with PNA.   PT Comments   Slightly less confused today from previous session. Increased tone in upper and lower extremities. Difficulty following or understanding commands. Session focused on getting patient to accept weight on his feet safely using lift equipment (sara lift) and progressing standing tolerance.   Follow Up Recommendations  SNF     Does the patient have the potential to tolerate intense rehabilitation     Barriers to Discharge        Equipment Recommendations  None recommended by PT    Recommendations for Other Services    Frequency Min 3X/week   Progress towards PT Goals Progress towards PT goals: Progressing toward goals  Plan Current plan remains appropriate    Precautions / Restrictions Precautions Precautions: Fall Restrictions Weight Bearing Restrictions: No   Pertinent Vitals/Pain Unable to state any pain, see PAINAD score    Mobility  Bed Mobility Bed Mobility: Not assessed Transfers Transfers: Sit to Stand;Stand to Sit Sit to Stand: 1: +2 Total assist;With upper extremity assist Sit to Stand: Patient Percentage: 10% Stand to Sit: 1: +2 Total assist;With upper extremity assist Stand to Sit: Patient Percentage: 10% Transfer via Lift Equipment: Hydrographic surveyor Details for Transfer Assistance: attempted sit->stand with 2 person assist, blocking knees and with max facilitation for anterior weight shift however pt having difficulty accepting weight through his lower extremities which were jerking and buckling  underneath him; pt then stood x2 with sara lift still needing facilitation for therapist for anterior weight shift and positioning of feet Ambulation/Gait Ambulation/Gait Assistance: Not tested (comment)    Exercises Other Exercises Other Exercises: hip hinges sitting edge of chair to encourage pre-lift movement and weight bearing through lower extremities with a flat foot     PT Goals (current goals can now be found in the care plan section) Acute Rehab PT Goals Patient Stated Goal: wife would liek patient to be seen by therapy as often as possible  Visit Information  Last PT Received On: 05/25/13 Assistance Needed: +2 History of Present Illness: Jesus Shah is a 79 y.o. male with recently diagnosed Lewis body dementia was brought to the ER patient was found to have increasing confusion with difficulty walking. CT head is negative for anything acute. Pt with PNA.    Subjective Data  Patient Stated Goal: wife would liek patient to be seen by therapy as often as possible   Cognition  Cognition Arousal/Alertness: Awake/alert Behavior During Therapy: WFL for tasks assessed/performed Overall Cognitive Status: Impaired/Different from baseline Area of Impairment: Attention;Memory;Following commands;Safety/judgement;Problem solving;Awareness Current Attention Level: Focused;Sustained Following Commands: Follows one step commands inconsistently (with max tactile cues) Safety/Judgement: Decreased awareness of safety;Decreased awareness of deficits Awareness: Intellectual Problem Solving: Slow processing;Decreased initiation;Difficulty sequencing;Requires verbal cues;Requires tactile cues    Balance  Static Standing Balance Static Standing - Balance Support: Bilateral upper extremity supported Static Standing - Level of Assistance: Other (comment) (with sara lift) Static Standing - Comment/# of Minutes: stood 3 minutes initially with sara lift, v/c's to encourage tall posture and  settle his feet/legs, pt appears to be either attempting to walk (  stepping in place) or demonstrating some sort of spasm/reflexive movement in his legs during standing; pt stood another time for 1 minute, more fatigued this attemp  End of Session PT - End of Session Equipment Utilized During Treatment: Gait belt;Other (comment) (sara lift) Activity Tolerance: Patient limited by fatigue;Patient tolerated treatment well Patient left: in chair;with call bell/phone within reach Nurse Communication: Mobility status;Need for lift equipment   GP     Jesus Shah 05/25/2013, 3:20 PM

## 2013-05-25 NOTE — Progress Notes (Signed)
ANTIBIOTIC CONSULT NOTE - Follow-up  Pharmacy Consult for vancomycin Indication: pneumonia  Allergies  Allergen Reactions  . Aricept [Donepezil Hcl] Diarrhea    Leg cramps.  . Exelon [Rivastigmine Tartrate] Dermatitis    Adhesive allergy    Patient Measurements: Height: 6\' 1"  (185.4 cm) Weight: 168 lb 11.2 oz (76.522 kg) IBW/kg (Calculated) : 79.9   Vital Signs: Temp: 98.4 F (36.9 C) (08/28 0547) Temp src: Axillary (08/28 0547) BP: 150/65 mmHg (08/28 0547) Pulse Rate: 81 (08/28 0547) Intake/Output from previous day: 08/27 0701 - 08/28 0700 In: 596.7 [I.V.:546.7; IV Piggyback:50] Out: 270 [Urine:270] Intake/Output from this shift:    Labs:  Recent Labs  05/23/13 0450 05/24/13 0703 05/24/13 1544 05/25/13 0520  WBC 10.1  --  10.0 9.0  HGB 12.6*  --  12.5* 12.6*  PLT 148*  --  156 152  CREATININE 0.89 1.00 1.24 1.27   Estimated Creatinine Clearance: 52.7 ml/min (by C-G formula based on Cr of 1.27). No results found for this basename: VANCOTROUGH, VANCOPEAK, VANCORANDOM, GENTTROUGH, GENTPEAK, GENTRANDOM, TOBRATROUGH, TOBRAPEAK, TOBRARND, AMIKACINPEAK, AMIKACINTROU, AMIKACIN,  in the last 72 hours   Microbiology: Recent Results (from the past 720 hour(s))  CULTURE, BLOOD (ROUTINE X 2)     Status: None   Collection Time    05/24/2013  8:09 PM      Result Value Range Status   Specimen Description BLOOD RIGHT FOREARM   Final   Special Requests BOTTLES DRAWN AEROBIC ONLY 10CC   Final   Culture  Setup Time     Final   Value: 05/22/2013 01:21     Performed at [facility]   Culture     Final   Value:        BLOOD CULTURE RECEIVED NO GROWTH TO DATE CULTURE WILL BE HELD FOR 5 DAYS BEFORE ISSUING A FINAL NEGATIVE REPORT     Performed at [facility]   Report Status PENDING   Incomplete  URINE CULTURE     Status: None   Collection Time    04/29/2013  8:38 PM      Result Value Range Status   Specimen Description URINE, RANDOM   Final   Special Requests  NONE   Final   Culture  Setup Time     Final   Value: 05/22/2013 01:27     Performed at [facility] Count     Final   Value: NO GROWTH     Performed at [facility]   Culture     Final   Value: NO GROWTH     Performed at [facility]   Report Status 05/23/2013 FINAL   Final  CULTURE, BLOOD (ROUTINE X 2)     Status: None   Collection Time    05/08/2013  8:55 PM      Result Value Range Status   Specimen Description BLOOD LEFT ARM   Final   Special Requests BOTTLES DRAWN AEROBIC AND ANAEROBIC 10CC EA   Final   Culture  Setup Time     Final   Value: 05/22/2013 01:21     Performed at [facility]   Culture     Final   Value:        BLOOD CULTURE RECEIVED NO GROWTH TO DATE CULTURE WILL BE HELD FOR 5 DAYS BEFORE ISSUING A FINAL NEGATIVE REPORT     Performed at [facility]   Report Status PENDING   Incomplete  CLOSTRIDIUM  DIFFICILE BY PCR     Status: None   Collection Time    05/23/13 10:00 AM      Result Value Range Status   C difficile by pcr NEGATIVE  NEGATIVE Final    Assessment: Pt on Vancomycin and Zosyn (total IV abx D#3) for CAP. Abx broadened 8/27 due to mental status decline/fever. Tmax 100.9, WBC wnl. Noted SCr up to 1.27, estimated CrCl down to 52 ml/min. UOP not accurately recorded but appears UOP ok.   8/24 Zosyn>> 8/25 Levaquin>> 8/27 Vanc>>  8/24 Bldx2>>ngtd 8/24 Urine>>neg 8/26 Cdiff>>neg 8/27 Urine>>   Goal of Therapy:  Vancomycin trough level 15-20 mcg/ml  Plan:  Change Vancomycin to 750 mg IV q12 hours. F/u renal function, cultures and clinical progress.  Christoper Fabian, PharmD, BCPS Clinical pharmacist, pager [phone] 05/25/2013,9:11 AM

## 2013-05-25 NOTE — Progress Notes (Signed)
Subjective: More alert today  Objective: Vital signs in last 24 hours: Temp:  [98.4 F (36.9 C)-100.9 F (38.3 C)] 98.4 F (36.9 C) (08/28 0547) Pulse Rate:  [81-103] 81 (08/28 0547) Resp:  [18-22] 20 (08/28 0547) BP: (118-150)/(50-74) 150/65 mmHg (08/28 0547) SpO2:  [92 %-95 %] 93 % (08/28 0547) Weight change:  Last BM Date: 05/24/13  Intake/Output from previous day: 08/27 0701 - 08/28 0700 In: 596.7 [I.V.:546.7; IV Piggyback:50] Out: 270 [Urine:270] Intake/Output this shift: Total I/O In: 596.7 [I.V.:546.7; IV Piggyback:50] Out: 120 [Urine:120]  General appearance: alert Resp: clear to auscultation bilaterally Cardio: regular rate and rhythm, S1, S2 normal, no murmur, click, rub or gallop Extremities: extremities normal, atraumatic, no cyanosis or edema  Lab Results:  Recent Labs  05/24/13 1544 05/25/13 0520  WBC 10.0 9.0  HGB 12.5* 12.6*  HCT 35.0* 36.1*  PLT 156 152   BMET  Recent Labs  05/24/13 1544 05/25/13 0520  NA 136 138  K 3.8 3.3*  CL 103 104  CO2 21 25  GLUCOSE 144* 129*  BUN 21 21  CREATININE 1.24 1.27  CALCIUM 9.2 9.2    Studies/Results: Dg Chest Port 1 View  05/24/2013   *RADIOLOGY REPORT*  Clinical Data: Pneumonia, cough, congestion, follow-up, history hypertension, Guillain-Barre syndrome  PORTABLE CHEST - 1 VIEW  Comparison: Portable exam 0656 hours compared 04/29/2013  Findings: Normal heart size, mediastinal contours and pulmonary vascularity. Atherosclerotic calcification aortic arch. Developing atelectasis versus more likely infiltrate in left lower lobe. Question slightly increased markings within the left upper and left lower lobes versus previous exam as well. No definite pleural effusion or pneumothorax.  IMPRESSION: Developing infiltrate or less likely atelectasis in left lower lobe. Subtle increase in markings in the right lung as well cannot exclude additional infiltrate.   Original Report Authenticated By: Ulyses Southward, M.D.     Medications: I have reviewed the patient's current medications.  Assessment/Plan: Principal Problem:  Community acquired pneumonia (sepsis was ruled out) day 3 IV antibiotics changed to vancomycin and zosyn, improved, more alert today and oxygenating well.  OOB and PT today. Active Problems:  Dementia chronic,on namenda, exelon patch had been stopped. Seroquel discontinued, concerned about possible oversedation and aspiration risk, could be retried as outpatient.  Small dose lorazapam prn  Hypokalemia, replete po  Disposition, PT recommending NHP. Discuss with wife    LOS: 4 days   Vittoria Noreen JOSEPH 05/25/2013, 6:58 AM

## 2013-05-25 NOTE — Clinical Social Work Note (Signed)
Clinical Social Work Department BRIEF PSYCHOSOCIAL ASSESSMENT 05/25/2013  Patient:  Jesus Shah, Jesus Shah     Account Number:  [id number]     Admit date:  05/24/2013  Clinical Social Worker:  Hulan Fray  Date/Time:  05/25/2013 02:56 PM  Referred by:  Care Management  Date Referred:  05/25/2013 Referred for  SNF Placement   Other Referral:   Interview type:  Family Other interview type:   wife- Quentez Lober    PSYCHOSOCIAL DATA Living Status:  WIFE Admitted from facility:   Level of care:   Primary support name:  Ula Lingo Primary support relationship to patient:  SPOUSE Degree of support available:   supportive    CURRENT CONCERNS Current Concerns  Post-Acute Placement   Other Concerns:    SOCIAL WORK ASSESSMENT / PLAN Clinical Social Worker received referral for patient needing SNF placement at discharge. CSW introduced self to patient and wife at bedside. Patient was in chair, but was not participating in assessment. Wife was agreeable to discuss SNF and prefers [facility] if available because she has visited that facility before and it's close to her house. CSW encouraged wife to think about second option and was not able to think of one. CSW encouraged wife to discuss with family and friends and wife was agreeable. Wife reported that she has two daughters and a son that she will speak with. Wife reported that she does not want referrals to be sent to [facility], [facility] or [facility].    CSW will complete FL2 for MD's signature and update her when bed offers are made.   Assessment/plan status:  Psychosocial Support/Ongoing Assessment of Needs Other assessment/ plan:   Information/referral to community resources:   SNF packet    PATIENT'S/FAMILY'S RESPONSE TO PLAN OF CARE: Wife was agreeable for CSW to initiate SNF search in [place]. Wife prefers [facility], but understands the need for alternative facility, pending availability of  preferred facility.

## 2013-05-26 LAB — BASIC METABOLIC PANEL
CO2: 24 mEq/L (ref 19–32)
Calcium: 9 mg/dL (ref 8.4–10.5)
Chloride: 104 mEq/L (ref 96–112)
Glucose, Bld: 146 mg/dL — ABNORMAL HIGH (ref 70–99)
Potassium: 3.3 mEq/L — ABNORMAL LOW (ref 3.5–5.1)
Sodium: 139 mEq/L (ref 135–145)

## 2013-05-26 MED ORDER — POTASSIUM CHLORIDE CRYS ER 20 MEQ PO TBCR
20.0000 meq | EXTENDED_RELEASE_TABLET | Freq: Every day | ORAL | Status: DC
  Filled 2013-05-26: qty 1

## 2013-05-26 MED ORDER — VANCOMYCIN HCL IN DEXTROSE 750-5 MG/150ML-% IV SOLN
750.0000 mg | Freq: Two times a day (BID) | INTRAVENOUS | Status: DC
  Administered 2013-05-26 – 2013-05-28 (×4): 750 mg via INTRAVENOUS
  Filled 2013-05-26 (×4): qty 150

## 2013-05-26 MED ORDER — SODIUM CHLORIDE 0.9 % IV SOLN
INTRAVENOUS | Status: DC
  Administered 2013-05-26 – 2013-05-29 (×6): via INTRAVENOUS
  Filled 2013-05-26 (×11): qty 1000

## 2013-05-26 MED ORDER — KCL IN DEXTROSE-NACL 10-5-0.45 MEQ/L-%-% IV SOLN
INTRAVENOUS | Status: DC
Start: 2013-05-26 — End: 2013-05-26
  Filled 2013-05-26: qty 1000

## 2013-05-26 NOTE — Progress Notes (Signed)
ANTIBIOTIC CONSULT NOTE - FOLLOW UP  Pharmacy Consult:  Vancomycin Indication:  PNA  Allergies  Allergen Reactions  . Aricept [Donepezil Hcl] Diarrhea    Leg cramps.  . Exelon [Rivastigmine Tartrate] Dermatitis    Adhesive allergy    Patient Measurements: Height: 6\' 1"  (185.4 cm) Weight: 168 lb 11.2 oz (76.522 kg) IBW/kg (Calculated) : 79.9  Vital Signs: Temp: 98.5 F (36.9 C) (08/29 1436) Temp src: Axillary (08/29 1436) BP: 139/62 mmHg (08/29 1436) Pulse Rate: 82 (08/29 1535) Intake/Output from previous day: 08/28 0701 - 08/29 0700 In: 3508.3 [P.O.:240; I.V.:3268.3] Out: 41 [Urine:41]  Labs:  Recent Labs  05/24/13 1544 05/25/13 0520 05/26/13 0540  WBC 10.0 9.0  --   HGB 12.5* 12.6*  --   PLT 156 152  --   CREATININE 1.24 1.27 1.53*   Estimated Creatinine Clearance: 43.8 ml/min (by C-G formula based on Cr of 1.53).  Recent Labs  05/26/13 1635  VANCOTROUGH 17.2     Microbiology: Recent Results (from the past 720 hour(s))  CULTURE, BLOOD (ROUTINE X 2)     Status: None   Collection Time    06/06/2013  8:09 PM      Result Value Range Status   Specimen Description BLOOD RIGHT FOREARM   Final   Special Requests BOTTLES DRAWN AEROBIC ONLY 10CC   Final   Culture  Setup Time     Final   Value: 05/22/2013 01:21     Performed at [facility]   Culture     Final   Value:        BLOOD CULTURE RECEIVED NO GROWTH TO DATE CULTURE WILL BE HELD FOR 5 DAYS BEFORE ISSUING A FINAL NEGATIVE REPORT     Performed at [facility]   Report Status PENDING   Incomplete  URINE CULTURE     Status: None   Collection Time    06/06/2013  8:38 PM      Result Value Range Status   Specimen Description URINE, RANDOM   Final   Special Requests NONE   Final   Culture  Setup Time     Final   Value: 05/22/2013 01:27     Performed at [facility] Count     Final   Value: NO GROWTH     Performed at [facility]   Culture     Final   Value: NO  GROWTH     Performed at [facility]   Report Status 05/23/2013 FINAL   Final  CULTURE, BLOOD (ROUTINE X 2)     Status: None   Collection Time    06-Jun-2013  8:55 PM      Result Value Range Status   Specimen Description BLOOD LEFT ARM   Final   Special Requests BOTTLES DRAWN AEROBIC AND ANAEROBIC 10CC EA   Final   Culture  Setup Time     Final   Value: 05/22/2013 01:21     Performed at [facility]   Culture     Final   Value:        BLOOD CULTURE RECEIVED NO GROWTH TO DATE CULTURE WILL BE HELD FOR 5 DAYS BEFORE ISSUING A FINAL NEGATIVE REPORT     Performed at [facility]   Report Status PENDING   Incomplete  CLOSTRIDIUM DIFFICILE BY PCR     Status: None   Collection Time    05/23/13 10:00 AM      Result  Value Range Status   C difficile by pcr NEGATIVE  NEGATIVE Final  URINE CULTURE     Status: None   Collection Time    05/24/13  3:44 PM      Result Value Range Status   Specimen Description URINE, CLEAN CATCH   Final   Special Requests NONE   Final   Culture  Setup Time     Final   Value: 05/24/2013 18:00     Performed at [facility] Count     Final   Value: NO GROWTH     Performed at [facility]   Culture     Final   Value: NO GROWTH     Performed at [facility]   Report Status 05/25/2013 FINAL   Final      Assessment: 79 YOM on vancomycin and Zosyn for PNA.  Patient's renal function is worsening (SCr 1.53 << 1.27 << 1.24 << 1) and the accuracy of his documented urine output is questionable.  Vancomycin level is not yet at steady-state but is within the desired range.   Goal of Therapy:  Vancomycin trough level 15-20 mcg/ml   Plan:  - Continue vanc at 750mg  IV Q12H - Monitor renal fxn, UOP, vanc trough as indicated - F/U change abx to PO as planned     Gwendolynn Merkey D. Laney Potash, PharmD, BCPS Pager:  [phone] 05/26/2013, 5:40 PM

## 2013-05-26 NOTE — Progress Notes (Addendum)
Subjective: Still very confused and some mild agitation, received lorazapam yesterday twice, ? If helping. Not swallowing pills or eating much  Objective: Vital signs in last 24 hours: Temp:  [98.3 F (36.8 C)-98.5 F (36.9 C)] 98.5 F (36.9 C) (08/28 2154) Pulse Rate:  [80-85] 85 (08/28 2154) Resp:  [18-22] 18 (08/28 2154) BP: (114-134)/(52-69) 134/69 mmHg (08/28 2154) SpO2:  [94 %-97 %] 96 % (08/28 2154) Weight change:  Last BM Date: 05/24/13  Intake/Output from previous day: 08/28 0701 - 08/29 0700 In: 240 [P.O.:240] Out: 41 [Urine:41] Intake/Output this shift: Total I/O In: -  Out: 1 [Urine:1]  General appearance: alert and cooperative Resp: clear to auscultation bilaterally Cardio: regular rate and rhythm, S1, S2 normal, no murmur, click, rub or gallop Extremities: extremities normal, atraumatic, no cyanosis or edema  Lab Results:  Recent Labs  05/24/13 1544 05/25/13 0520  WBC 10.0 9.0  HGB 12.5* 12.6*  HCT 35.0* 36.1*  PLT 156 152   BMET  Recent Labs  05/24/13 1544 05/25/13 0520  NA 136 138  K 3.8 3.3*  CL 103 104  CO2 21 25  GLUCOSE 144* 129*  BUN 21 21  CREATININE 1.24 1.27  CALCIUM 9.2 9.2    Studies/Results: Dg Chest Port 1 View  05/24/2013   *RADIOLOGY REPORT*  Clinical Data: Pneumonia, cough, congestion, follow-up, history hypertension, Guillain-Barre syndrome  PORTABLE CHEST - 1 VIEW  Comparison: Portable exam 0656 hours compared 05/12/2013  Findings: Normal heart size, mediastinal contours and pulmonary vascularity. Atherosclerotic calcification aortic arch. Developing atelectasis versus more likely infiltrate in left lower lobe. Question slightly increased markings within the left upper and left lower lobes versus previous exam as well. No definite pleural effusion or pneumothorax.  IMPRESSION: Developing infiltrate or less likely atelectasis in left lower lobe. Subtle increase in markings in the right lung as well cannot exclude additional  infiltrate.   Original Report Authenticated By: Ulyses Southward, M.D.    Medications: I have reviewed the patient's current medications.  Assessment/Plan: Principal Problem:  Community acquired pneumonia (sepsis was ruled out)  vancomycin and zosyn 3, improved, more alert today and oxygenating well. Afebrile.  Change to augmentin over weekend, NHP next week Active Problems:  Dementia chronic,on namenda, exelon patch had been stopped. Seroquel 75 mg discontinued, concerned about possible oversedation and aspiration risk, could be retried as outpatient. Still very confused and some agitation, ? If lorazepam helps and may be detrimental, stop lorazepam and follow Nutrition po intake poor, bedside swallowing evaluation. Hypokalemia, replete iv and po  Disposition,NHP for rehab.  Should be ready for discharge early next week.  Addendum: Creatinine rising and potassium still low (not taking his po meds).  Change to normal saline with IV KCL   LOS: 5 days   [facility] 05/26/2013, 6:33 AM

## 2013-05-26 NOTE — Clinical Social Work Placement (Signed)
Clinical Social Work Department CLINICAL SOCIAL WORK PLACEMENT NOTE 05/26/2013  Patient:  Jesus Shah, Jesus Shah  Account Number:  [id number] Admit date:  2013-06-10  Clinical Social Worker:  Hulan Fray  Date/time:  05/26/2013 08:43 AM  Clinical Social Work is seeking post-discharge placement for this patient at the following level of care:   SKILLED NURSING   (*CSW will update this form in Epic as items are completed)   05/26/2013  Patient/family provided with [facility] Department of Clinical Social Work's list of facilities offering this level of care within the geographic area requested by the patient (or if unable, by the patient's family).  05/26/2013  Patient/family informed of their freedom to choose among providers that offer the needed level of care, that participate in Medicare, Medicaid or managed care program needed by the patient, have an available bed and are willing to accept the patient.  05/26/2013  Patient/family informed of [facility]' ownership interest in [facility], as well as of the fact that they are under no obligation to receive care at this facility.  PASARR submitted to EDS on 05/26/2013 PASARR number received from E[employer] on   FL2 transmitted to all facilities in geographic area requested by pt/family on  05/26/2013 FL2 transmitted to all facilities within larger geographic area on   Patient informed that his/her managed care company has contracts with or will negotiate with  certain facilities, including the following:     Patient/family informed of bed offers received:   Patient chooses bed at  Physician recommends and patient chooses bed at    Patient to be transferred to  on   Patient to be transferred to facility by   The following physician request were entered in Epic:   Additional Comments:

## 2013-05-26 NOTE — Progress Notes (Signed)
Rehab Admissions Coordinator Note:  Patient was screened by Brock Ra for appropriateness for an Inpatient Acute Rehab Consult.  At this time, we are recommending Skilled Nursing Facility.  Pt's insurance will not cover CIR for his diagnosis.Richardson Dopp, Sherene Sires 05/26/2013, 3:04 PM  I can be reached at [phone].

## 2013-05-26 NOTE — Progress Notes (Signed)
Physical Therapy Treatment Patient Details Name: Jesus Shah MRN: [id number] DOB: 10/05/1932 Today's Date: 05/26/2013 Time: 4098-1191 PT Time Calculation (min): 29 min  PT Assessment / Plan / Recommendation  History of Present Illness Jesus Shah is a 79 y.o. male with recently diagnosed Lewis body dementia was brought to the ER patient was found to have increasing confusion with difficulty walking. CT head is negative for anything acute. Pt with PNA.   PT Comments   More lethargic today limiting participation. Stood x3 using sara lift. Wife very concerned about possible oversedation and would like him to be able to participate with therapy more. She became very emotional with me today and would really rather her husband go to CIR. I explained to her the admission process but will ask for admission coordinator input prior to asking for full consult. This is a big change for him physically for the patient. Per wife he was walking around their house with RW prior to this admission with PNA. Attempted to reposition his head in better midline position, tends to hold his head fixed to the left.    Follow Up Recommendations  CIR (wife would rather her husband go to CIR, will ask for CIR admission screening prior to full consult, if CIR not an option he will need SNF)     Does the patient have the potential to tolerate intense rehabilitation     Barriers to Discharge        Equipment Recommendations  None recommended by PT    Recommendations for Other Services OT consult  Frequency     Progress towards PT Goals Progress towards PT goals: Progressing toward goals (slow progress, limited by lethargy)  Plan Discharge plan needs to be updated    Precautions / Restrictions Precautions Precautions: Fall Restrictions Weight Bearing Restrictions: No   Pertinent Vitals/Pain See vitals section    Mobility  Bed Mobility Bed Mobility: Rolling Left;Left Sidelying to Sit Rolling Left: 3:  Mod assist Left Sidelying to Sit: 1: +2 Total assist Left Sidelying to Sit: Patient Percentage: 10% Details for Bed Mobility Assistance: facilitation for trunk rotation, heavy lift to sit up because of trunk rigidity and lethargy Transfers Transfers: Sit to Stand;Stand to Sit Transfer via Lift Equipment: Hydrographic surveyor Details for Transfer Assistance: sit<>stand x3 with sara lift today needing max facilitation to extend and place hands onto grips of sara due to rigidity of his upper extremities; in standing pt with spastic movements causing him to stand on his toes and shift weight back and forth Ambulation/Gait Ambulation/Gait Assistance: attempted to get pt to take steps in the sara however pt not advancing lower extremities so this was discontinued   Exercises General Exercises - Lower Extremity Heel Slides: AAROM;Both;5 reps;Supine Other Exercises Other Exercises: cervical rotation x 5 with gentle stretch at end range (close to midline is end range, head remains fixed to the left)     PT Goals (current goals can now be found in the care plan section)    Visit Information  Last PT Received On: 05/26/13 Assistance Needed: +2 History of Present Illness: Jesus Shah is a 79 y.o. male with recently diagnosed Lewis body dementia was brought to the ER patient was found to have increasing confusion with difficulty walking. CT head is negative for anything acute. Pt with PNA.    Subjective Data      Cognition  Cognition Arousal/Alertness: Lethargic Behavior During Therapy: Flat affect Overall Cognitive Status: Impaired/Different from baseline Area of Impairment: Attention;Memory;Following commands;Safety/judgement;Problem solving;Awareness  Current Attention Level: Focused Memory: Decreased recall of precautions;Decreased short-term memory Following Commands: Follows one step commands inconsistently Problem Solving: Slow processing;Decreased initiation;Difficulty sequencing;Requires  verbal cues;Requires tactile cues General Comments: significant difficulty today with any commands because he was so lethargic needing max cueing for attention, barely focusing attention for brief moments, mostly when standing    Balance  Static Standing Balance Static Standing - Balance Support: Bilateral upper extremity supported Static Standing - Level of Assistance: 4: Min assist;3: Mod assist (stedy lift) Static Standing - Comment/# of Minutes: stood x3 with sara lift, first attempt was for 5 minutes, as he fatigued he was only able to stand for a minute; needed additional verbal and faciliatory cues for tall posture and attention, head tends to stay fixed to the left, limited tissue extensibility to rotate past midline, pt attempting to participate in simple commands during standing  End of Session PT - End of Session Equipment Utilized During Treatment: Gait belt Activity Tolerance: Patient limited by lethargy Patient left: in chair;with call bell/phone within reach;with family/visitor present Nurse Communication: Mobility status;Need for lift equipment   GP     Jeury Mcnab H 05/26/2013, 1:30 PM

## 2013-05-27 LAB — BASIC METABOLIC PANEL
CO2: 20 mEq/L (ref 19–32)
Chloride: 108 mEq/L (ref 96–112)
Glucose, Bld: 125 mg/dL — ABNORMAL HIGH (ref 70–99)
Potassium: 3.3 mEq/L — ABNORMAL LOW (ref 3.5–5.1)
Sodium: 140 mEq/L (ref 135–145)

## 2013-05-27 MED ORDER — POTASSIUM CHLORIDE CRYS ER 20 MEQ PO TBCR
20.0000 meq | EXTENDED_RELEASE_TABLET | Freq: Two times a day (BID) | ORAL | Status: DC
  Administered 2013-05-27 (×2): 20 meq via ORAL
  Filled 2013-05-27 (×2): qty 1

## 2013-05-27 NOTE — Progress Notes (Signed)
Speech Language Pathology Dysphagia Treatment Patient Details Name: Jesus Shah MRN: [id number] DOB: [birth year] Today's Date: 05/27/2013 Time: 1500-1530 SLP Time Calculation (min): 30 min  Assessment / Plan / Recommendation Clinical Impression  F/u for diet tolerance of puree consistency with pudding thick liquids from recommendations of initial BSE completed on 05/26/13.  The following note taken from  initial evaluation, completed by Jesus Shah CCC-SLP.   Results of BSE states " patient demonstrates decreased hyoid laryngeal elevation with multiple swallows and poor mangement of pharygneal secretions.  Suspect patient to have an impaired swallow function at baseline rsulting from dementia though impairment exacerbated by altered mental status.  Given admission with PNA pt will need MBS prior to d/c but is nt able to particpate at this time due to delirium.  For now recommend puree/pudding with no liquids due to high risk of aspiration with liquid textures.  Patient may have meds crushed in puree and offered minimal bites of puree when alert and requesting PO ( RN reports arousal waxes and wanes).  He may also have ice chips PRN s/p oral care. SLP will follow for MBS readiness vs. trials of thickened liquids."    Patient alert this date but no PO's administered as patient agitated and attempting to remove catheter. Spouse reports patient tolerating current diet consistency in small amounts.  ST to f/u on 8/31 to assess readiness to complete objective evaluation.      Diet Recommendation  Continue with Current Diet: Pudding-thick liquid;Dysphagia 1 (puree)    SLP Plan Continue with current plan of care;New goals to be determined pending objective testing      Swallowing Goals  SLP Swallowing Goals Swallow Study Goal #2 - Progress: Progressing toward goal Swallow Study Goal #3 - Progress: Progressing toward goal  General Temperature Spikes Noted: No Respiratory Status: Room  air Behavior/Cognition: Alert;Confused;Distractible;Doesn't follow directions Oral Cavity - Dentition: Adequate natural dentition Patient Positioning: Upright in bed  Oral Cavity - Oral Hygiene Does patient have any of the following "at risk" factors?: Other - dysphagia Patient is HIGH RISK - Oral Care Protocol followed (see row info): Yes Patient is AT RISK - Oral Care Protocol followed (see row info): Yes   Dysphagia Treatment Treatment focused on: Skilled observation of diet tolerance;Patient/family/caregiver education Family/Caregiver Educated: Spouse Patient observed directly with PO's: No Reason PO's not observed: Other (comment) (agitated )   GO    Jesus Fowler MS, CCC-SLP [phone] [facility] 05/27/2013, 6:14 PM

## 2013-05-27 NOTE — Progress Notes (Signed)
Subjective: Had good night no complaints  Objective: Vital signs in last 24 hours: Temp:  [98.5 F (36.9 C)-99.2 F (37.3 C)] 99.2 F (37.3 C) (08/29 2218) Pulse Rate:  [82-88] 88 (08/29 2218) Resp:  [18-20] 18 (08/29 2218) BP: (135-139)/(62-65) 135/65 mmHg (08/29 2218) SpO2:  [96 %-97 %] 96 % (08/29 2218) Weight change:  Last BM Date: 05/24/13  Intake/Output from previous day: 08/29 0701 - 08/30 0700 In: 2263.3 [I.V.:2013.3; IV Piggyback:250] Out: -  Intake/Output this shift:    Resp: clear to auscultation bilaterally Cardio: regular rate and rhythm, S1, S2 normal, no murmur, click, rub or gallop  Lab Results:  Recent Labs  05/24/13 1544 05/25/13 0520  WBC 10.0 9.0  HGB 12.5* 12.6*  HCT 35.0* 36.1*  PLT 156 152   BMET  Recent Labs  05/25/13 0520 05/26/13 0540  NA 138 139  K 3.3* 3.3*  CL 104 104  CO2 25 24  GLUCOSE 129* 146*  BUN 21 21  CREATININE 1.27 1.53*  CALCIUM 9.2 9.0    Studies/Results: No results found.  Medications:  Scheduled: . albuterol  2.5 mg Nebulization TID  . ipratropium  0.5 mg Nebulization TID  . memantine  10 mg Oral BID  . piperacillin-tazobactam (ZOSYN)  IV  3.375 g Intravenous Q8H  . potassium chloride  20 mEq Oral Daily  . sodium chloride  3 mL Intravenous Q12H  . tamsulosin  0.4 mg Oral QPC supper  . vancomycin  750 mg Intravenous Q12H    Assessment/Plan: 1. Pneumonia, lung exam very good minimal coughing T 99.2 2. Dementia alert and pleasantly confused does know he is in the hospital  3. Hypokalemic will replace  LOS: 6 days   [facility] 05/27/2013, 7:35 AM

## 2013-05-27 NOTE — Progress Notes (Signed)
Nebulizer treatment started, but patient pulling at mask, wanting it off, and refusing treatment. Tx on for about 3 minutes, rhonchus/diminished breath sounds at this time, RT will continue to monitor.

## 2013-05-28 ENCOUNTER — Inpatient Hospital Stay (HOSPITAL_COMMUNITY): Payer: Medicare Other

## 2013-05-28 DIAGNOSIS — Z515 Encounter for palliative care: Secondary | ICD-10-CM

## 2013-05-28 LAB — CBC WITH DIFFERENTIAL/PLATELET
Basophils Absolute: 0 10*3/uL (ref 0.0–0.1)
Basophils Relative: 0 % (ref 0–1)
HCT: 39.8 % (ref 39.0–52.0)
Hemoglobin: 14 g/dL (ref 13.0–17.0)
MCH: 31.7 pg (ref 26.0–34.0)
MCV: 90 fL (ref 78.0–100.0)
Monocytes Absolute: 0.5 10*3/uL (ref 0.1–1.0)
Neutro Abs: 12 10*3/uL — ABNORMAL HIGH (ref 1.7–7.7)
Neutrophils Relative %: 93 % — ABNORMAL HIGH (ref 43–77)
Platelets: 152 10*3/uL (ref 150–400)
RBC: 4.42 MIL/uL (ref 4.22–5.81)
RDW: 13.4 % (ref 11.5–15.5)

## 2013-05-28 LAB — CULTURE, BLOOD (ROUTINE X 2): Culture: NO GROWTH

## 2013-05-28 MED ORDER — METHYLPREDNISOLONE SODIUM SUCC 125 MG IJ SOLR
60.0000 mg | Freq: Once | INTRAMUSCULAR | Status: AC
  Administered 2013-05-28: 60 mg via INTRAVENOUS
  Filled 2013-05-28: qty 0.96

## 2013-05-28 MED ORDER — VANCOMYCIN HCL IN DEXTROSE 1-5 GM/200ML-% IV SOLN
1000.0000 mg | INTRAVENOUS | Status: DC
  Administered 2013-05-29 – 2013-05-30 (×2): 1000 mg via INTRAVENOUS
  Filled 2013-05-28 (×2): qty 200

## 2013-05-28 MED ORDER — CHLORHEXIDINE GLUCONATE 0.12 % MT SOLN
5.0000 mL | Freq: Two times a day (BID) | OROMUCOSAL | Status: DC
  Administered 2013-05-28 – 2013-05-31 (×6): 5 mL via OROMUCOSAL
  Filled 2013-05-28 (×6): qty 15

## 2013-05-28 MED ORDER — MORPHINE SULFATE (CONCENTRATE) 10 MG /0.5 ML PO SOLN
10.0000 mg | ORAL | Status: DC | PRN
  Administered 2013-05-28 – 2013-05-29 (×3): 10 mg via ORAL
  Filled 2013-05-28 (×5): qty 0.5

## 2013-05-28 MED ORDER — ACETAMINOPHEN 650 MG RE SUPP
650.0000 mg | RECTAL | Status: DC | PRN
  Administered 2013-05-28 – 2013-05-30 (×4): 650 mg via RECTAL
  Filled 2013-05-28 (×4): qty 1

## 2013-05-28 MED ORDER — MORPHINE SULFATE (CONCENTRATE) 10 MG /0.5 ML PO SOLN
10.0000 mg | Freq: Four times a day (QID) | ORAL | Status: DC
  Administered 2013-05-29 (×3): 10 mg via SUBLINGUAL
  Filled 2013-05-28 (×5): qty 0.5

## 2013-05-28 MED ORDER — DIAZEPAM 5 MG/ML IJ SOLN
2.5000 mg | INTRAMUSCULAR | Status: DC | PRN
  Administered 2013-05-28 – 2013-05-30 (×4): 2.5 mg via INTRAVENOUS
  Filled 2013-05-28 (×5): qty 2

## 2013-05-28 NOTE — Progress Notes (Signed)
ANTIBIOTIC CONSULT NOTE - FOLLOW UP  Pharmacy Consult:  Vancomycin Indication:  PNA  Allergies  Allergen Reactions  . Aricept [Donepezil Hcl] Diarrhea    Leg cramps.  . Exelon [Rivastigmine Tartrate] Dermatitis    Adhesive allergy    Patient Measurements: Height: 6\' 1"  (185.4 cm) Weight: 168 lb 11.2 oz (76.522 kg) IBW/kg (Calculated) : 79.9  Vital Signs: Temp: 99.1 F (37.3 C) (08/31 0646) Temp src: Oral (08/31 0646) BP: 137/80 mmHg (08/31 0646) Pulse Rate: 103 (08/31 0646) Intake/Output from previous day: 08/30 0701 - 08/31 0700 In: 1181.7 [I.V.:1181.7] Out: 300 [Urine:300]  Labs:  Recent Labs  05/26/13 0540 05/27/13 0620  CREATININE 1.53* 1.82*   Estimated Creatinine Clearance: 36.8 ml/min (by C-G formula based on Cr of 1.82).  Recent Labs  05/26/13 1635  VANCOTROUGH 17.2     Microbiology: Recent Results (from the past 720 hour(s))  CULTURE, BLOOD (ROUTINE X 2)     Status: None   Collection Time    05/17/2013  8:09 PM      Result Value Range Status   Specimen Description BLOOD RIGHT FOREARM   Final   Special Requests BOTTLES DRAWN AEROBIC ONLY 10CC   Final   Culture  Setup Time     Final   Value: 05/22/2013 01:21     Performed at [facility]   Culture     Final   Value:        BLOOD CULTURE RECEIVED NO GROWTH TO DATE CULTURE WILL BE HELD FOR 5 DAYS BEFORE ISSUING A FINAL NEGATIVE REPORT     Performed at [facility]   Report Status PENDING   Incomplete  URINE CULTURE     Status: None   Collection Time    05/24/2013  8:38 PM      Result Value Range Status   Specimen Description URINE, RANDOM   Final   Special Requests NONE   Final   Culture  Setup Time     Final   Value: 05/22/2013 01:27     Performed at [facility] Count     Final   Value: NO GROWTH     Performed at [facility]   Culture     Final   Value: NO GROWTH     Performed at [facility]   Report Status 05/23/2013 FINAL   Final   CULTURE, BLOOD (ROUTINE X 2)     Status: None   Collection Time    05/20/2013  8:55 PM      Result Value Range Status   Specimen Description BLOOD LEFT ARM   Final   Special Requests BOTTLES DRAWN AEROBIC AND ANAEROBIC 10CC EA   Final   Culture  Setup Time     Final   Value: 05/22/2013 01:21     Performed at [facility]   Culture     Final   Value:        BLOOD CULTURE RECEIVED NO GROWTH TO DATE CULTURE WILL BE HELD FOR 5 DAYS BEFORE ISSUING A FINAL NEGATIVE REPORT     Performed at [facility]   Report Status PENDING   Incomplete  CLOSTRIDIUM DIFFICILE BY PCR     Status: None   Collection Time    05/23/13 10:00 AM      Result Value Range Status   C difficile by pcr NEGATIVE  NEGATIVE Final  URINE CULTURE     Status: None   Collection  Time    05/24/13  3:44 PM      Result Value Range Status   Specimen Description URINE, CLEAN CATCH   Final   Special Requests NONE   Final   Culture  Setup Time     Final   Value: 05/24/2013 18:00     Performed at [facility] Count     Final   Value: NO GROWTH     Performed at [facility]   Culture     Final   Value: NO GROWTH     Performed at [facility]   Report Status 05/25/2013 FINAL   Final      Assessment: 79 YOM on vancomycin and Zosyn for PNA.  Patient's renal function is worsening (SCr 1.82 <<1.53 << 1.27 << 1.24 << 1) and the accuracy of his documented urine output is questionable.     Goal of Therapy:  Vancomycin trough level 15-20 mcg/ml   Plan:  - Change vanc to 10000mg  IV Q24H - Monitor renal fxn, UOP, vanc trough as indicated - F/U change abx to PO as planned     Talbert Cage, PharmD Pager:  [phone] 05/28/2013, 7:53 AM

## 2013-05-28 NOTE — Progress Notes (Signed)
Subjective: Acte respiratory compromise  Objective: Vital signs in last 24 hours: Temp:  [98.7 F (37.1 C)-99.1 F (37.3 C)] 99.1 F (37.3 C) (08/31 0646) Pulse Rate:  [82-103] 91 (08/31 1100) Resp:  [8-24] 24 (08/31 1100) BP: (103-140)/(59-80) 140/69 mmHg (08/31 1100) SpO2:  [70 %-99 %] 99 % (08/31 1100) Weight change:  Last BM Date: 05/28/13  Intake/Output from previous day: 08/30 0701 - 08/31 0700 In: 1181.7 [I.V.:1181.7] Out: 300 [Urine:300] Intake/Output this shift:    Neck: no JVD Cardio: regular rate and rhythm, S1, S2 normal, no murmur, click, rub or gallop Extremities: extremities normal, atraumatic, no cyanosis or edema Neurologic: Mental status: Alert, oriented, thought content appropriate, not responsive verbally  Lab Results: No results found for this basename: WBC, HGB, HCT, PLT,  in the last 72 hours BMET  Recent Labs  05/26/13 0540 05/27/13 0620  NA 139 140  K 3.3* 3.3*  CL 104 108  CO2 24 20  GLUCOSE 146* 125*  BUN 21 22  CREATININE 1.53* 1.82*  CALCIUM 9.0 8.9    Studies/Results: Dg Chest Port 1 View  05/28/2013   *RADIOLOGY REPORT*  Clinical Data: Worsening cough  PORTABLE CHEST - 1 VIEW  Comparison: 05/24/2013, 2013-06-11  Findings: Worsening consolidative airspace process in the lower lobes and right upper lobe compatible with multilobar pneumonia versus asymmetric edema.  No definite pleural effusion.  No pneumothorax.  Cardiac contours are obscured.  Atherosclerosis of the aorta.  Degenerative changes of the spine.  IMPRESSION: Worsening bibasilar and right upper lobe consolidative airspace disease compatible with pneumonia or asymmetric alveolar edema.   Original Report Authenticated By: Judie Petit. Miles Costain, M.D.    Medications:  Scheduled: . albuterol  2.5 mg Nebulization TID  . ipratropium  0.5 mg Nebulization TID  . memantine  10 mg Oral BID  . piperacillin-tazobactam (ZOSYN)  IV  3.375 g Intravenous Q8H  . potassium chloride  20 mEq Oral BID   . sodium chloride  3 mL Intravenous Q12H  . tamsulosin  0.4 mg Oral QPC supper  . [START ON 05/29/2013] vancomycin  1,000 mg Intravenous Q24H    Assessment/Plan: 1. Patient had witnessed aspiration event while being fed small amount of appropiate consistency food and sitting upright.  pcxr shows worsening rul infiltrate. There are no signs of volume overload. Overall prognosis very poor, I have had a 30 min discussion with wife and daughter that this is the result of his progressive neurologic condition, and he is at risk for repeated aspiration.  We discussed ventilator support and cardiac resuscitation as well as tube feeding and they understand these would not reverse the underlying Lewy body dementia and likely lead to increased suffering and poor quality of life.  They understand the dementia will progress despite aggressive treatment of pulmonary situation.  They agree with DNR and palliative care inpur  LOS: 7 days   [facility] 05/28/2013, 12:10 PM

## 2013-05-28 NOTE — Progress Notes (Signed)
Subjective: Alert denies complaints  Objective: Vital signs in last 24 hours: Temp:  [98.7 F (37.1 C)-99.1 F (37.3 C)] 99.1 F (37.3 C) (08/31 0646) Pulse Rate:  [82-103] 103 (08/31 0646) Resp:  [20] 20 (08/31 0646) BP: (121-137)/(59-80) 137/80 mmHg (08/31 0646) SpO2:  [96 %-98 %] 96 % (08/31 0646) Weight change:  Last BM Date: 05/24/13  Intake/Output from previous day: 08/30 0701 - 08/31 0700 In: 1181.7 [I.V.:1181.7] Out: 300 [Urine:300] Intake/Output this shift: Total I/O In: -  Out: 300 [Urine:300]  Resp: rhonchi bilaterally  Lab Results: No results found for this basename: WBC, HGB, HCT, PLT,  in the last 72 hours BMET  Recent Labs  05/26/13 0540 05/27/13 0620  NA 139 140  K 3.3* 3.3*  CL 104 108  CO2 24 20  GLUCOSE 146* 125*  BUN 21 22  CREATININE 1.53* 1.82*  CALCIUM 9.0 8.9    Studies/Results: No results found.  Medications:  Scheduled: . albuterol  2.5 mg Nebulization TID  . ipratropium  0.5 mg Nebulization TID  . memantine  10 mg Oral BID  . piperacillin-tazobactam (ZOSYN)  IV  3.375 g Intravenous Q8H  . potassium chloride  20 mEq Oral BID  . sodium chloride  3 mL Intravenous Q12H  . tamsulosin  0.4 mg Oral QPC supper  . vancomycin  750 mg Intravenous Q12H    Assessment/Plan: 1. Pneumonia still week and minimal cough will continue IV ab 2. Dementia is alert and oriented to location no behavioral problems 3. Hypokalemia will check bmet tomorrow on replacement potassium 4. Dysphagia will continue pureed diet pudding thick liquid   LOS: 7 days   [facility] 05/28/2013, 6:56 AM

## 2013-05-28 NOTE — Progress Notes (Signed)
05/28/13 sx patient pre-post treatment think lg amt tan sx. Patient is stable RT will continue to monitor pt.

## 2013-05-28 NOTE — Progress Notes (Signed)
Witnessed pt increased coughing and tachypnea with small bites of pureed foods.  Breathing is labored despite suctioning.  O2 sats at 70%, increased to 72% with nasal cannula at 5L.  Non-rebreather mask applied, RT called, rapid response initiated.  O2 at 95% with non-rebreather.  MD called, orders received for portable CXR, NPO, transfer to step-down.  Report called to Vernona Rieger, RN.  Pt stable and transferred to 2608.  Wife at bedside.

## 2013-05-28 NOTE — Progress Notes (Signed)
SLP Cancellation Note  Patient Details Name: Jesus Shah MRN: [id number] DOB: October 10, 1932   Cancelled treatment:  F/u to assess readiness for completion of MBS.  Diagnostic treatment not completed due to change in medical status.  ST to follow for POC.  [name] MS, CCC-SLP [phone] [facility] 05/28/2013, 3:51 PM

## 2013-05-28 NOTE — Progress Notes (Signed)
Clinical Social Worker (CSW) contacted patient's wife Mitcheal Sweetin [phone] to give bed offers at [facility], [facility], and [facility]. Wife did not accept any of these facilities and reported they are too far for her to drive. She reported that [facility] is still her first choice and CSW informed her they said no due to no space. Wife reported that the patient has moved to a step down untit because his phenomena has gotten worse and the patient is staying in the hospital longer then anticipated. Wife would like weekday CSW to follow up with [facility] to see if they have space. CSW explained that when patient is medically ready for D/C she and the patient are going to have to make a decision about the facilities that have made bed offers or going home. Wife verbalized her understanding and reported that she would take the patient home if no other beds become available closer to her home.  Jetta Lout, LCSWA Weekend CSW [phone]

## 2013-05-28 NOTE — Consult Note (Addendum)
Palliative Medicine Team at [facility]  Date: 05/28/2013   Patient Name: Jesus Shah  DOB: 1933-01-13  MRN: [id number]  Age / Sex: 79 y.o., male   PCP: Lillia Mountain, MD Referring Physician: Lillia Mountain, MD  Jesus Shah is a 79 yo man with realtively newly diagnosed Lewy Body dementia that has been rapidly progressive over the course of the last 3 months who was admitted with AMS and PNA, this morning he had an episode of overt aspiration, during careful hand feeding by nursing staff and went into acute respiratory failure with hypoxia and significant distress. He was subsequently moved to the SDU, placed on NRB and broad spectrum antibiotics. Follow up CXR showed worseinng infiltrates and probable progression to ARDS. DNR status was determined and preliminary goals discussion was done by Dr. Merlene Laughter.PMT asked to assist family with further goals.  Problem List:  1. Progressive Lewy Body Dementia, has been evalutaed at [facility], and followed by Dr. Anne Hahn [facility]  Rapidly declining QOL for 3 months, hallucinations, tremors, dysphagia, admitted with PNA on 8/24  Had acute episode of choking, overt aspiration and altered mental status on 8/31, moved to SDU where he has remained unresponsive with a R. Hemiparesis  Significant hypoxia, requiring NRB  Agitation and tremors 2.  Cerebrovascular Disease, Microvasular dx Brain 3.   HTN 4.   Hyperlipidemia 5.   BPH   I met with family to provide support and to offer full comfort care as a very reasonable and humane care plan given his Lewy Body dementia, functional status decline and prospects for his future QOL and care burden. They are in a state of shock and processing the news of his rapid decline and severe illness but very reasonable and dealing with acute grief both of present events and after I presented what the future may look like if he survives his current condition.  Summary of Goals: 1. DNR 2. Maintain full medical  support including antibiotics and respiratory support but also maintain a very strong focus on his comfort. 3. For symptom management they have agreed to using concentrated low dose morphine SL, ativan/valium for agitation, and transition to nasal cannula as soon as possible, they also request no urethral or condom catheter be used. I added a daily dose of Solu-medrol for the inflammatory pneumonitis which may help with his audible wheezing.   Time: 50 minutes. Greater than 50%  of this time was spent counseling and coordinating care related to the above assessment and plan.  Signed by: Edsel Petrin, DO  05/28/2013, 10:33 PM  Please contact Palliative Medicine Team phone at [phone] for questions and concerns.

## 2013-05-29 LAB — BASIC METABOLIC PANEL
Chloride: 116 mEq/L — ABNORMAL HIGH (ref 96–112)
Creatinine, Ser: 1.38 mg/dL — ABNORMAL HIGH (ref 0.50–1.35)
GFR calc Af Amer: 55 mL/min — ABNORMAL LOW (ref >90)
Sodium: 147 mEq/L — ABNORMAL HIGH (ref 135–145)

## 2013-05-29 MED ORDER — MORPHINE SULFATE (CONCENTRATE) 10 MG /0.5 ML PO SOLN
10.0000 mg | ORAL | Status: DC
  Administered 2013-05-29 – 2013-05-30 (×6): 10 mg via SUBLINGUAL
  Filled 2013-05-29 (×2): qty 0.5

## 2013-05-29 NOTE — Progress Notes (Signed)
CSW spoke with [facility], who confirmed they have one male bed open. CSW then contacted wife Jesus Shah who stated her husband took a turn for the worse and wants to take it one day at a time before she commits to a facility. CSW stated she will continue to follow patient and will assist with dc needs when medically ready.  Maree Krabbe, MSW, [facility] [phone]

## 2013-05-29 NOTE — Progress Notes (Signed)
Occupational Therapy Discharge Patient Details Name: Jesus Shah MRN: [id number] DOB: [birth year] Today's Date: 05/29/2013 Time:  -     Patient discharged from OT services secondary to medical decline - will need to re-order OT to resume therapy services.  Please see latest therapy progress note for current level of functioning and progress toward goals.    Reviewed chart, and spoke with RN.  Pt now moving toward full comfort care.  RR 35.  Please re-order if condition changes and pt becomes appropriate for OT.  Tomesha Sargent [place], OTR/L [phone]   GO     Jesus Shah 05/29/2013, 10:40 AM

## 2013-05-29 NOTE — Progress Notes (Signed)
Physical Therapy Discharge Patient Details Name: Jesus Shah MRN: [id number] DOB: [birth year] Today's Date: 05/29/2013 Time:  -     Patient discharged from PT services secondary to medical decline - will need to re-order PT to resume therapy services.  Please see latest therapy progress note for current level of functioning and progress toward goals.    Pt now moving toward full comfort care per RN.  Please re-order PT services if condition changes & pt becomes appropriate for  PT.       [name], [place] [phone] 05/29/2013

## 2013-05-29 NOTE — Progress Notes (Signed)
Subjective: Events noted past 24 hours, patient currently on palliative care. He has face mask oxygen and appears comfortable. I have spoken with family x15 minutes.  Objective: Vital signs in last 24 hours: Temp:  [97.9 F (36.6 C)-101.8 F (38.8 C)] 97.9 F (36.6 C) (09/01 0815) Pulse Rate:  [60-109] 87 (09/01 0815) Resp:  [14-41] 35 (09/01 0815) BP: (93-141)/(36-93) 109/61 mmHg (09/01 0815) SpO2:  [88 %-100 %] 92 % (09/01 0815) Weight change:  Last BM Date: 05/28/13  Intake/Output from previous day: 08/31 0701 - 09/01 0700 In: 3568.3 [I.V.:3118.3; IV Piggyback:450] Out: -  Intake/Output this shift:    Resp: Rhonchi bibasilar right greater than left  Lab Results:  Results for orders placed during the hospital encounter of 05/30/13 (from the past 24 hour(s))  BASIC METABOLIC PANEL     Status: Abnormal   Collection Time    05/29/13  5:35 AM      Result Value Range   Sodium 147 (*) 135 - 145 mEq/L   Potassium 3.8  3.5 - 5.1 mEq/L   Chloride 116 (*) 96 - 112 mEq/L   CO2 19  19 - 32 mEq/L   Glucose, Bld 156 (*) 70 - 99 mg/dL   BUN 32 (*) 6 - 23 mg/dL   Creatinine, Ser 1.91 (*) 0.50 - 1.35 mg/dL   Calcium 9.1  8.4 - 47.8 mg/dL   GFR calc non Af Amer 48 (*) >90 mL/min   GFR calc Af Amer 55 (*) >90 mL/min      Studies/Results: Dg Chest Port 1 View  05/28/2013   *RADIOLOGY REPORT*  Clinical Data: Worsening cough  PORTABLE CHEST - 1 VIEW  Comparison: 05/24/2013, 05/30/2013  Findings: Worsening consolidative airspace process in the lower lobes and right upper lobe compatible with multilobar pneumonia versus asymmetric edema.  No definite pleural effusion.  No pneumothorax.  Cardiac contours are obscured.  Atherosclerosis of the aorta.  Degenerative changes of the spine.  IMPRESSION: Worsening bibasilar and right upper lobe consolidative airspace disease compatible with pneumonia or asymmetric alveolar edema.   Original Report Authenticated By: Judie Petit. Miles Costain, M.D.    Medications:   Scheduled: . albuterol  2.5 mg Nebulization TID  . chlorhexidine  5 mL Mouth/Throat BID  . ipratropium  0.5 mg Nebulization TID  . morphine CONCENTRATE  10 mg Sublingual Q6H  . piperacillin-tazobactam (ZOSYN)  IV  3.375 g Intravenous Q8H  . sodium chloride  3 mL Intravenous Q12H  . vancomycin  1,000 mg Intravenous Q24H   Continuous: . 0.9 % sodium chloride with kcl 100 mL/hr at 05/28/13 0415    Assessment/Plan: Elderly male Lewy body dementia now with aspiration pneumonia currently receiving comfort care. Continue current antibiotics, supportive measures as outlined by palliative care medicine.  LOS: 8 days   Llewyn Heap D 05/29/2013, 11:37 AM

## 2013-05-29 NOTE — Progress Notes (Addendum)
Palliative Medicine Team Progress Note    Mr. Auker is unchanged this AM, more comfortable however on very low doses of morphine and valium. He has not been able to communicate, largely aphasic since Saturday, and has not moved his right side per family which greatly raises my suspicions for an acute stroke at the time he aspirated-his CXR looks extremely bad with diffuse bibasilar, right upper lobe pneumonitis/infiltrates. He remains on NRB sats now in the upper 90's he is having severe coughing spells due to collection secretions in his throat. Family at bedside, we again discussed the big picture-difficult to know if we should pursue additional brain imaging such as MRI for the sake of just "knowing" but I think it would really help this family move to a place of complete acceptance and full comfort mode. I do not think his unresponsiveness is related to comfort medication since they are only being given in low doses and he was mostly like that prior to their initiation.  Filed Vitals:   05/29/13 1100  BP: 108/52  Pulse: 62  Temp: 98.4 F (36.9 C)  Resp: 18   Scheduled Meds: . albuterol  2.5 mg Nebulization TID  . chlorhexidine  5 mL Mouth/Throat BID  . ipratropium  0.5 mg Nebulization TID  . morphine CONCENTRATE  10 mg Sublingual Q6H  . piperacillin-tazobactam (ZOSYN)  IV  3.375 g Intravenous Q8H  . sodium chloride  3 mL Intravenous Q12H  . vancomycin  1,000 mg Intravenous Q24H   Continuous Infusions: . 0.9 % sodium chloride with kcl 100 mL/hr at 05/28/13 0415   PRN Meds:.acetaminophen, diazepam, morphine CONCENTRATE   Assessment:  He is actively dying of complications of Lewy Body Dementia with severe PNA. He is unresponsive but opens his eyes-minimal communication.  Plan:   Wean his face mask O2 to nasal cannula in the SDU then transfer to med surg floor which is overall a less invasive and more peaceful environment.  Change morphine to scheduled q4 he calms down very  well after administration  Valium has been helpful when he starts getting restless will continue that prn  For now maintain antibiotics, steroid and consider additional brain imaging per primary team  Scheduled nebs helpful.  Start a scop patch for secretions   Time: 35 minutes. Greater than 50%  of this time was spent counseling and coordinating care related to the above assessment and plan.    Anderson Malta, DO Palliative Medicine

## 2013-05-29 NOTE — Progress Notes (Signed)
SLP Cancellation Note  Patient Details Name: Jesus Shah MRN: [id number] DOB: 06/01/1933   Cancelled treatment:       Reason Eval/Treat Not Completed: Medical issues which prohibited therapy. Spoke with RN who reported that patient remains unresponsive and advised SLP to sign off. Please reconsult if needed.   Ferdinand Lango MA, CCC-SLP [phone]    [name] Meryl 05/29/2013, 1:07 PM

## 2013-05-29 NOTE — Progress Notes (Signed)
Nutrition Brief Note  Chart reviewed. Pt with progressive Lewy body dementia now with AMS and aspiration pneumonia.  Pt with functional decline and worsening respiratory status.  Pt now transitioning to comfort care. He is currently NPO. Pt has experienced variable and overall poor PO since admission.  He would benefit from nutrition support if appropriate per GOC.  No nutrition interventions warranted at this time.  Please re-consult as needed or if aggressive nutrition interventions warranted.  Loyce Dys, MS RD LDN Clinical Inpatient Dietitian Pager: [phone] Weekend/After hours pager: [phone]

## 2013-05-29 DEATH — deceased

## 2013-05-30 MED ORDER — MORPHINE BOLUS VIA INFUSION
2.0000 mg | INTRAVENOUS | Status: DC | PRN
  Administered 2013-05-31 (×2): 2 mg via INTRAVENOUS
  Filled 2013-05-30: qty 2

## 2013-05-30 MED ORDER — MORPHINE SULFATE 10 MG/ML IJ SOLN
1.0000 mg/h | INTRAMUSCULAR | Status: DC
  Administered 2013-05-30: 1 mg/h via INTRAVENOUS
  Filled 2013-05-30: qty 10

## 2013-05-30 MED ORDER — DIAZEPAM 5 MG/ML IJ SOLN
2.5000 mg | Freq: Two times a day (BID) | INTRAMUSCULAR | Status: DC
  Administered 2013-05-30 – 2013-05-31 (×3): 2.5 mg via INTRAVENOUS
  Filled 2013-05-30 (×2): qty 2

## 2013-05-30 MED ORDER — SCOPOLAMINE 1 MG/3DAYS TD PT72
1.0000 | MEDICATED_PATCH | TRANSDERMAL | Status: DC
  Administered 2013-05-30: 1.5 mg via TRANSDERMAL
  Filled 2013-05-30: qty 1

## 2013-05-30 MED ORDER — SODIUM CHLORIDE 0.9 % IV SOLN
1.0000 mg/h | INTRAVENOUS | Status: DC
  Filled 2013-05-30: qty 25

## 2013-05-30 MED ORDER — KETOROLAC TROMETHAMINE 30 MG/ML IJ SOLN
30.0000 mg | Freq: Four times a day (QID) | INTRAMUSCULAR | Status: DC | PRN
  Administered 2013-05-30: 30 mg via INTRAVENOUS
  Filled 2013-05-30 (×2): qty 1

## 2013-05-30 NOTE — Progress Notes (Signed)
05/30/13 1430  Clinical Encounter Type  Visited With Family  Visit Type Initial;Spiritual support;Social support  Referral From Nurse  Spiritual Encounters  Spiritual Needs Emotional  Stress Factors  Patient Stress Factors None identified  Family Stress Factors Health changes

## 2013-05-30 NOTE — Progress Notes (Signed)
Spoke with RN who states plans are for comfort care- noted MD has consulted Palliative Team. Left my card and contact #'s with family at bedside- please call CSW as needs arise- will follow for support and for assistance as appropriate.  Reece Levy, MSW [phone]

## 2013-05-30 NOTE — Progress Notes (Signed)
Subjective: Patient not responsive, no new issues per nursing. Palliative care input appreciated. I did discuss with daughter transfer out of ICU.  Objective: Vital signs in last 24 hours: Temp:  [98.2 F (36.8 C)-102 F (38.9 C)] 98.2 F (36.8 C) (09/02 0402) Pulse Rate:  [67-140] 67 (09/02 0700) Resp:  [12-30] 20 (09/02 0700) BP: (96-140)/(54-68) 123/66 mmHg (09/02 0402) SpO2:  [91 %-99 %] 93 % (09/02 0759) FiO2 (%):  [98 %-100 %] 98 % (09/02 0402) Weight change:  Last BM Date: 05/28/13  Intake/Output from previous day: 09/01 0701 - 09/02 0700 In: 543 [I.V.:243; IV Piggyback:300] Out: 200 [Urine:200] Intake/Output this shift:    General appearance: underresponsive Resp: bilateral rhonchi Cardio: regular rate and rhythm, S1, S2 normal, no murmur, click, rub or gallop  Lab Results:  No results found for this or any previous visit (from the past 24 hour(s)).    Studies/Results: No results found.  Medications:  Scheduled: . albuterol  2.5 mg Nebulization TID  . chlorhexidine  5 mL Mouth/Throat BID  . ipratropium  0.5 mg Nebulization TID  . morphine CONCENTRATE  10 mg Sublingual Q4H  . piperacillin-tazobactam (ZOSYN)  IV  3.375 g Intravenous Q8H  . sodium chloride  3 mL Intravenous Q12H  . vancomycin  1,000 mg Intravenous Q24H   Continuous: . 0.9 % sodium chloride with kcl 20 mL/hr at 05/29/13 1359   WUJ:[id number], diazepam, morphine CONCENTRATE  Assessment/Plan: We will continue palliative measures for this patient will fluid body dementia who unfortunately had aspiration pneumonia. Continue antibiotics for now. Transfer out of ICU. Palliative care input greatly appreciated   LOS: 9 days   Nikitta Sobiech D 05/30/2013, 11:09 AM

## 2013-05-30 NOTE — Progress Notes (Signed)
Patient ID: Jesus Shah, male   DOB: July 08, 1933, 79 y.o.   MRN: [id number] See previous dictated note, patient receiving palliative care we will  transfer to a floor bed

## 2013-05-30 NOTE — Progress Notes (Addendum)
Palliative Medicine Team Progress Note   Jesus Shah continues to be unresponsive. He is having worsening SOB, very congested despite cutting back him fluids. He is febrile to 102, diaphoretic and had a deep grimace across his brow. He has not communicated with his family in a meaningful way since Saturday evening.  Temp:  [98.2 F (36.8 C)-102 F (38.9 C)] 99.1 F (37.3 C) (09/02 1200) Pulse Rate:  [67-140] 67 (09/02 0700) Resp:  [12-30] 20 (09/02 0700) BP: (96-140)/(54-68) 123/66 mmHg (09/02 0402) SpO2:  [91 %-99 %] 93 % (09/02 0759) FiO2 (%):  [95 %-100 %] 95 % (09/02 1200)  On NRB, does not look comfortable, tachypnea, family at bedside.  Assessment:  Jesus Shah is dying of severe and fulminant aspiration PNA secondary to what was likely an acute stroke which occurred on Sunday morning while eating his breakfast. Ensuing pneumonitis and respiratory failure with AMS occurred and he was moved to the SDU for aggressive treatment and monitoring. Now on day 3 of unresponsive he continues to decline with worsening chest congestion dyspnea and no neurological recovery- he is too unstable to undergo any diagnostic imaging.  Prognosis: hours-days but holding normal vital signs with interventions, not more than 5-7 day most certainly now that our focus is 100% comfort care.   Plan:   Will start Morphine infusion for comfort and bolus for uncontrolled pain and dyspnea  Maintain Valium PRN, will schedule now BID  Change full face mask to nasal cannula  This patient is not stable enough to transport out of the hospital  I anticipate a hospital death-will move to 6N  Time: 50 minutes at bedside with family. Everyone is coping as well as possible, grief is appropriate-I spent a large part of my time validating the very difficult journey with Lewy Body Dementia: the loss of his executive function,the behavioral disturbances/loss of his personality and listened to stories of their struggle for  the past few months.   Dr. Valentina Lucks is primary and per family will return tomorrow. Will communicate re: next steps in his care-family feel that transporting him would contribute to discomfort and I agree-may need to stay inpatient with Hospice.   Anderson Malta, DO Palliative Medicine

## 2013-05-31 DIAGNOSIS — IMO0002 Reserved for concepts with insufficient information to code with codable children: Secondary | ICD-10-CM

## 2013-05-31 DIAGNOSIS — R0609 Other forms of dyspnea: Secondary | ICD-10-CM

## 2013-05-31 NOTE — Progress Notes (Signed)
Physical Therapy Discharge Patient Details Name: Labaron Digirolamo MRN: [id number] DOB: March 21, 1933 Today's Date: 05/29/2013 Time:  -     Patient discharged from PT services secondary to medical decline - will need to re-order PT to resume therapy services.  Please see latest therapy progress note for current level of functioning and progress toward goals.    Pt now moving toward full comfort care per RN.  Please re-order PT services if condition changes & pt becomes appropriate for  PT.       [name], [place] [phone] 05/29/2013  Agree with above assessment.  Lewis Shock, PT, DPT Pager #: [phone] Office #: [phone]

## 2013-05-31 NOTE — Progress Notes (Signed)
Subjective: Patient unresponsive, breathing not labored, appears comfortable. Family at bedside, all questions answered  Objective: Vital signs in last 24 hours: Temp:  [99.1 F (37.3 C)-100.4 F (38 C)] 100.4 F (38 C) (09/03 0500) Pulse Rate:  [15-81] 15 (09/03 0500) Resp:  [18] 18 (09/02 1346) BP: (102-104)/(35-52) 104/35 mmHg (09/03 0500) SpO2:  [90 %] 90 % (09/03 0733) FiO2 (%):  [95 %] 95 % (09/02 1200) Weight:  [74.39 kg (164 lb)] 74.39 kg (164 lb) (09/02 1346) Weight change:  Last BM Date: 05/28/13  Intake/Output from previous day: 09/02 0701 - 09/03 0700 In: 33 [I.V.:33] Out: 550 [Urine:550] Intake/Output this shift:    Resp: less rhonchi bilateral Cardio: regular rate and rhythm  Lab Results:  No results found for this or any previous visit (from the past 24 hour(s)).    Studies/Results: No results found.  Medications:  Scheduled: . albuterol  2.5 mg Nebulization TID  . chlorhexidine  5 mL Mouth/Throat BID  . diazepam  2.5 mg Intravenous Q12H  . ipratropium  0.5 mg Nebulization TID  . scopolamine  1 patch Transdermal Q72H  . sodium chloride  3 mL Intravenous Q12H   Continuous: . morphine 1 mg/hr (05/30/13 1424)  . 0.9 % sodium chloride with kcl 20 mL/hr at 05/29/13 1359    Assessment/Plan: Continue supportive/comfort care for patient with the Lewy Body dementia , complicated by acute aspiration pneumonia. Part of care input greatly appreciated   LOS: 10 days   Zaylyn Bergdoll D 06/10/2013, 9:28 AM

## 2013-05-31 NOTE — Progress Notes (Signed)
Patient AV:[id number] Steller      DOB: [birth year]      NWG:[id number]   Palliative Medicine Team at [facility] Progress Note    Subjective: Patient resting very comfortably.  No evidence for distress- brow not furrowed, breathing easily.  Wife, two daughters and grandson at the bedside.  No new needs.  Filed Vitals:   06-04-13 0500  BP: 104/35  Pulse: 15  Temp: 100.4 F (38 C)  Resp:    Physical exam:  General:  No acute distress, no brow furrowing, no use of accessory muscles Pupils not examined.  Patient did close lips slightly when mouth wiped.   Chest: coarse rhonchi left chest, no wheezing CVS: regular , rate and rhythm, S1, S2 Abd: soft, not distended, no grimace on palpation Ext: warm, no mottling.previous toe amputations on right foot clean dry and intact.  Assesment and plan: 79 yr old white male with advanced , rapidly declining Lewy Body dementia.  Patient comfortable on SL morphine- two doses yesterday.  Last dose 0438.  1.  DNR/DNI  2.  Dyspnea:  Continue prn roxanol  3.  Agitation: Valium prn.  Prognosis remains poor .  He is near death.  Offered family support.  Total time: 15 min  Reviewed with Dr. Nehemiah Settle.  Mackenna Kamer L. Ladona Ridgel, MD MBA The Palliative Medicine Team at [facility] Phone: [phone] Pager: [phone]

## 2013-05-31 NOTE — Progress Notes (Signed)
Chaplain followed up with patient's family in the hallway. Patient was being seen by nursing staff. Chaplain provided family with a ministry of presence and support. Chaplain to follow up with patient and family.   06/05/2013 1100  Clinical Encounter Type  Visited With Family;Patient not available  Visit Type Follow-up;Social support  Spiritual Encounters  Spiritual Needs Emotional  Stress Factors  Patient Stress Factors None identified  Family Stress Factors None identified

## 2013-06-27 ENCOUNTER — Ambulatory Visit: Payer: Medicare Other | Admitting: Neurology

## 2013-06-28 NOTE — Discharge Summary (Signed)
Physician Discharge Summary  Patient ID: Jesus Shah MRN: [id number] DOB/AGE: [birth year] 79 y.o.  Admit date: 04/29/2013 Discharge date:   Admission Diagnoses: Community-acquired pneumonia Discharge Diagnoses:  Principal Problem:  Aspiration pneumonia DO NOT RESUSCITATE/palliative care Community acquired pneumonia Active Problems:   Dementia   Discharged Condition: patient expired in hospital, while receiving palliative comfort care Hospital Course:  Patient was admitted to the hospital with a diagnosis of pneumonia. Patient had a history of dementia,   he was started on IV antibiotics, after progression of his pulmonary status with concerns for aspiration,antibiotics were broadened, patient had an acute respiratory change and required transfer to the ICU. While there it was decided to pursue palliative care because of aspiration pneumonia in the setting of progressive dementia. Patient was seen by palliative care medicine. Meds ultimately were transitioned to medication for comfort. He was transferred out of the ICU and received comfort care until his demise. Consults: Treatment Team:  Palliative [facility]  Significant Diagnostic Studies:Ct Head (brain) Wo Contrast  05/20/2013   *RADIOLOGY REPORT*  Clinical Data: Altered mental status, code stroke  CT HEAD WITHOUT CONTRAST  Technique:  Contiguous axial images were obtained from the base of the skull through the vertex without contrast.  Comparison: 12/06/2012; brain MRI - 02/02/2013  Findings:  Redemonstrated global atrophy with sulcal prominence and centralized volume loss with commiserate ex vacuo dilatation of the ventricular system.  Scattered periventricular hypodensities appear grossly unchanged and again favored to represent the sequela of microvascular ischemic disease.  Given background parenchymal abnormalities, there is no CT evidence of acute large territory infarct.  No intraparenchymal or extra-axial mass or  hemorrhage. Unchanged size and configuration of the ventricles and basilar cisterns.  No midline shift.  Limited visualization of the paranasal sinuses and mastoid air cells are normal.  Regional soft tissues are normal.  No displaced calvarial fracture.  Post bilateral cataract surgery.  IMPRESSION: Stable findings of atrophy and microvascular ischemic disease without acute intracranial process.  Above findings discussed with Dr. Fonnie Jarvis at 2003.   Original Report Authenticated By: Tacey Ruiz, MD   Mr Brain W Wo Contrast  05/04/2013   [facility]  NEUROIMAGING REPORT   STUDY DATE: 05/03/13 PATIENT NAME: Jesus Shah DOB: 07/25/1933 MRN: [id number]  ORDERING CLINICIAN: York Spaniel, MD  CLINICAL HISTORY: 79 year old male with memory loss and confusion.  EXAM: MRI brain (with and without)  TECHNIQUE: MRI of the brain with and without contrast was obtained utilizing 5 mm axial slices with T1, T2, T2 flair, T2 star gradient echo and diffusion weighted views.  T1 sagittal, T2 coronal and postcontrast views in the axial and coronal plane were obtained. CONTRAST: 8ml gadavist IMAGING SITE: [facility] [employer] [facility]  FINDINGS:  No abnormal lesions are seen on diffusion-weighted views to suggest acute ischemia. The cortical sulci, fissures and cisterns are notable for mild perisylvian and mesial temporal atrophy. Lateral, third and fourth ventricle are normal in size and appearance. No extra-axial fluid collections are seen. No evidence of mass effect or midline shift. Moderate periventricular, subcortical, bifrontal, biparietal and pontine T2 hyperintensity.  There is a left frontal developmental venous anomaly. No associated cavernous malformation. No abnormal lesions are seen on post contrast views.  On sagittal views the posterior fossa, pituitary gland and corpus callosum are unremarkable. No evidence of intracranial hemorrhage on gradient-echo views. The orbits and their contents, paranasal  sinuses and calvarium are unremarkable.  Intracranial flow voids are present.   IMPRESSION:  Abnormal  MRI brain (with and without) demonstrating: 1. Mild perisylvian and mesial temporal atrophy.  2. Moderate periventricular, subcortical, bifrontal, biparietal and pontine T2 hyperintensities. These findings are non-specific and considerations include autoimmune, inflammatory, post-infectious, microvascular ischemic etiologies.  3. Incidental left frontal developmental venous anomaly. No associated cavernous malformation.  4. No significant change from MRI on 02/02/13.   INTERPRETING PHYSICIAN:  Suanne Marker, MD Certified in Neurology, Neurophysiology and Neuroimaging  [facility] [address], [address] [place], [place] [address] [phone]   Dg Chest [place] 1 View  05/28/2013   *RADIOLOGY REPORT*  Clinical Data: Worsening cough  PORTABLE CHEST - 1 VIEW  Comparison: 05/24/2013, 05/26/2013  Findings: Worsening consolidative airspace process in the lower lobes and right upper lobe compatible with multilobar pneumonia versus asymmetric edema.  No definite pleural effusion.  No pneumothorax.  Cardiac contours are obscured.  Atherosclerosis of the aorta.  Degenerative changes of the spine.  IMPRESSION: Worsening bibasilar and right upper lobe consolidative airspace disease compatible with pneumonia or asymmetric alveolar edema.   Original Report Authenticated By: Judie Petit. Miles Costain, M.D.   Dg Chest Port 1 View  05/24/2013   *RADIOLOGY REPORT*  Clinical Data: Pneumonia, cough, congestion, follow-up, history hypertension, Guillain-Barre syndrome  PORTABLE CHEST - 1 VIEW  Comparison: Portable exam 0656 hours compared 05/14/2013  Findings: Normal heart size, mediastinal contours and pulmonary vascularity. Atherosclerotic calcification aortic arch. Developing atelectasis versus more likely infiltrate in left lower lobe. Question slightly increased markings within the left upper and left lower lobes versus  previous exam as well. No definite pleural effusion or pneumothorax.  IMPRESSION: Developing infiltrate or less likely atelectasis in left lower lobe. Subtle increase in markings in the right lung as well cannot exclude additional infiltrate.   Original Report Authenticated By: Ulyses Southward, M.D.   Dg Chest Portable 1 View  05/24/2013   *RADIOLOGY REPORT*  Clinical Data: Sepsis, fever  PORTABLE CHEST - 1 VIEW  Comparison: None.  Findings: Heart size and mediastinal contours are within normal range.  Mild interstitial prominence appears chronic and retrocardiac/left lung base opacities.  Aortic atherosclerosis.  No overt pleural effusion.  No pneumothorax.  Degenerative changes of the spine and shoulders without acute osseous finding.  IMPRESSION: Mild interstitial prominence appears chronic.  However, in a nonsmoker, would also include atypical/viral infection.  Mild left lower lung/retrocardiac opacity; atelectasis versus infiltrate.   Original Report Authenticated By: Jearld Lesch, M.D.        Disposition: 20-Expired      Signed: Deejay Koppelman D 06/23/2013, 2:52 PM

## 2013-06-28 DEATH — deceased

## 2013-07-05 ENCOUNTER — Ambulatory Visit: Payer: Medicare Other | Admitting: Neurology

## 2013-09-19 ENCOUNTER — Ambulatory Visit: Payer: Medicare Other | Admitting: Neurology

## 2014-09-03 IMAGING — CT CT HEAD W/O CM
1 of 2 series · 13 of 30 positions shown, 17 images · non-contrast
Comparison: 02/27/2010

CLINICAL DATA: Poorly responsive, nausea/vomiting, dementia

CT HEAD WITHOUT CONTRAST
TECHNIQUE: Contiguous axial images were obtained from the base of
the skull through the vertex without contrast.

[Series 2: brain · axial · 0.49mm/px · z∈[+149,+286]mm · 13 of 32 slices shown, 17 images]
[im 3/32  brain]
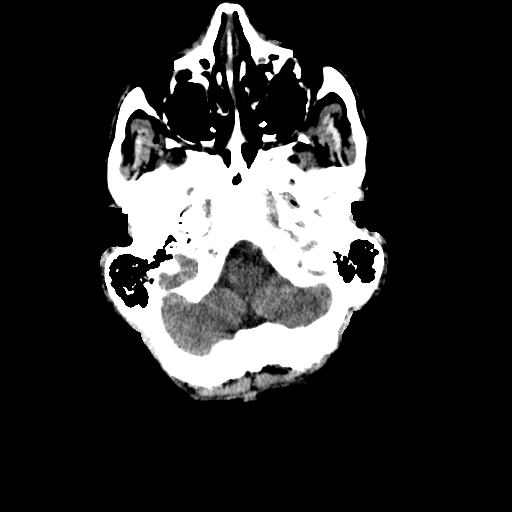
[im 3/32  bone]
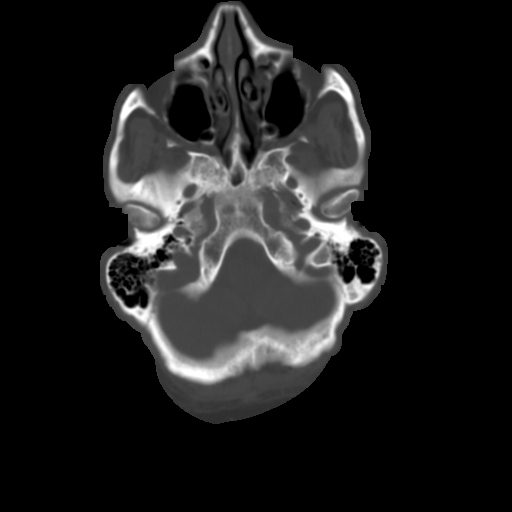
[im 5/32  brain]
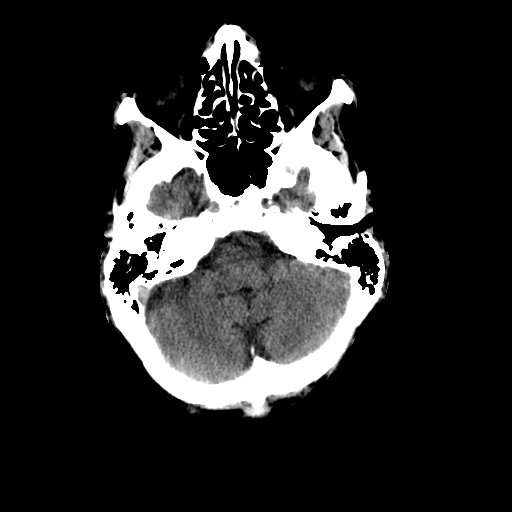
[im 7/32  brain]
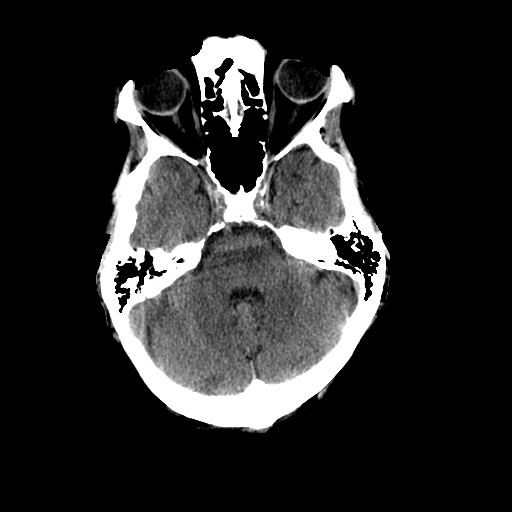
[im 9/32  brain]
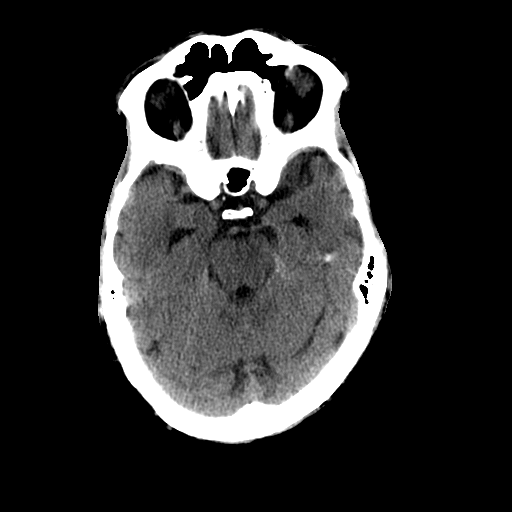
[im 12/32  brain]
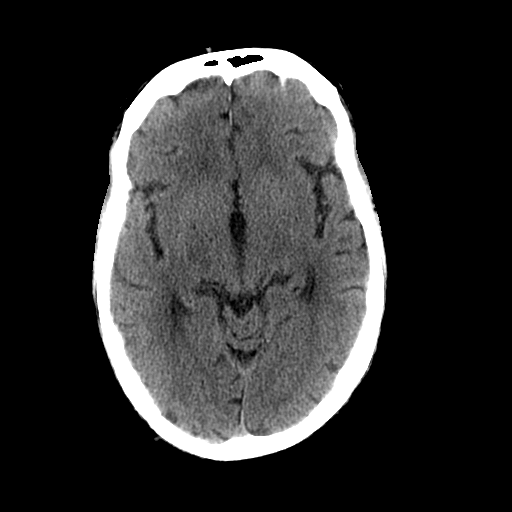
[im 12/32  bone]
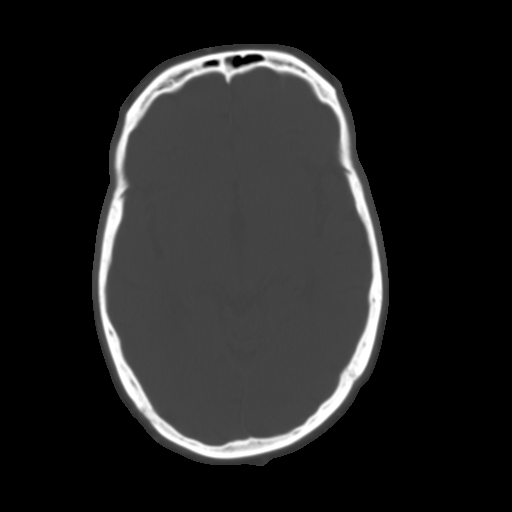
[im 14/32  brain]
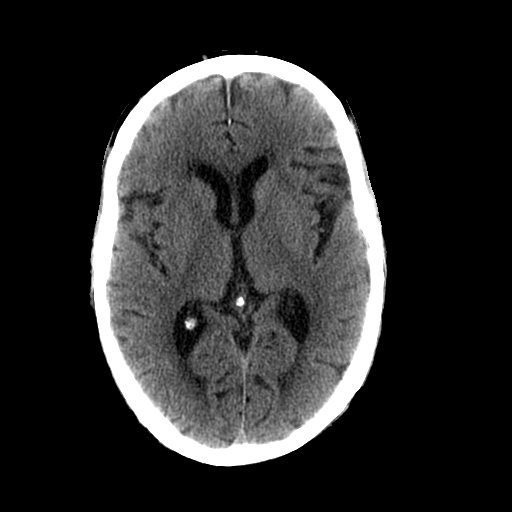
[im 16/32  brain]
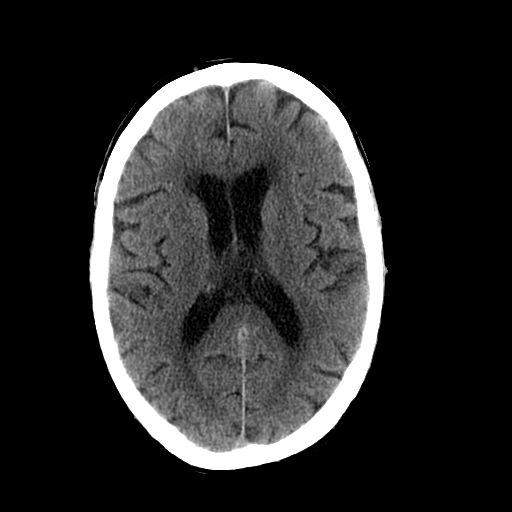
[im 18/32  brain]
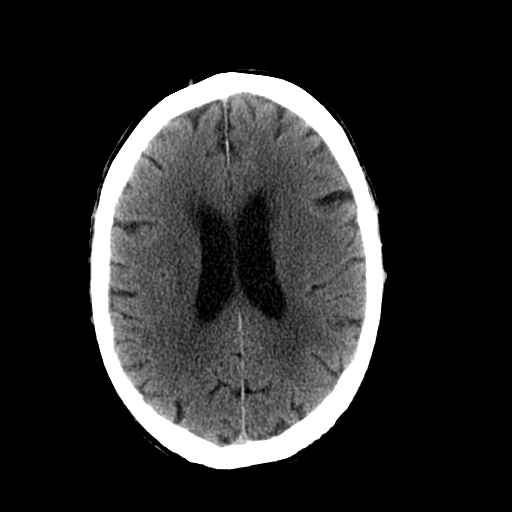
[im 20/32  brain]
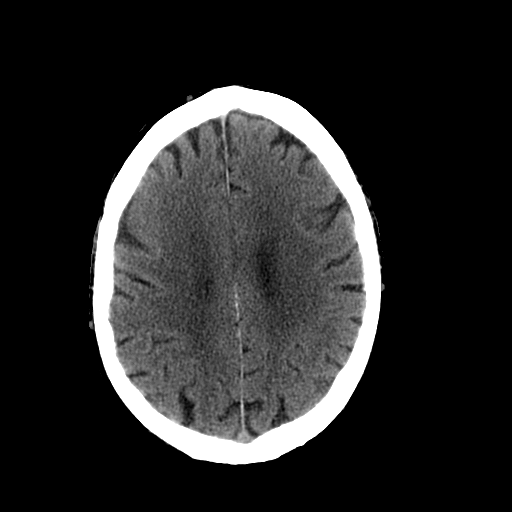
[im 20/32  bone]
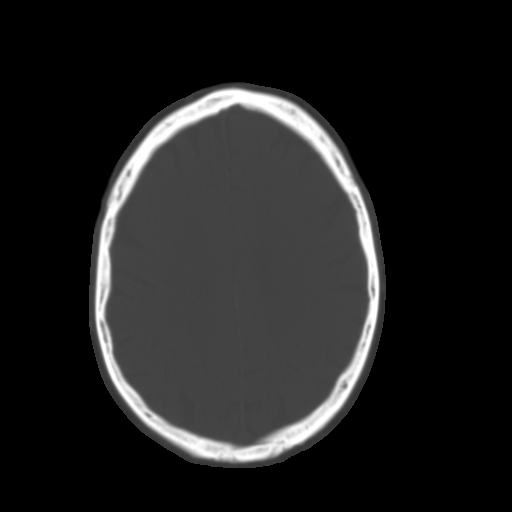
[im 23/32  brain]
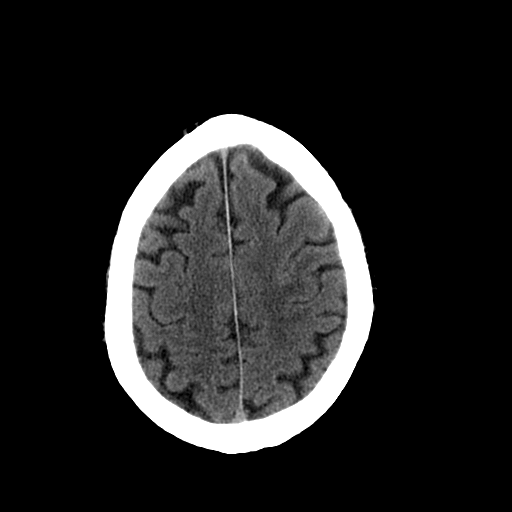
[im 25/32  brain]
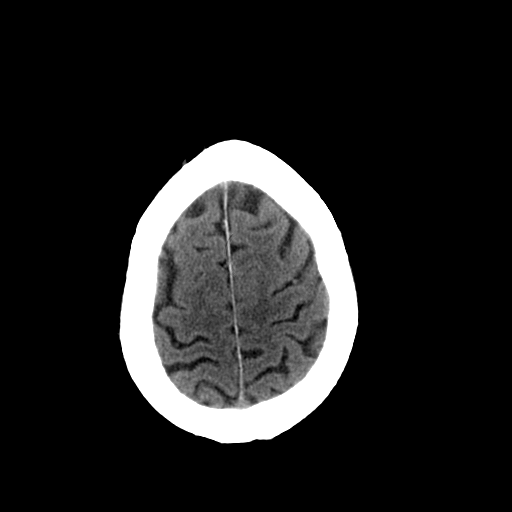
[im 27/32  brain]
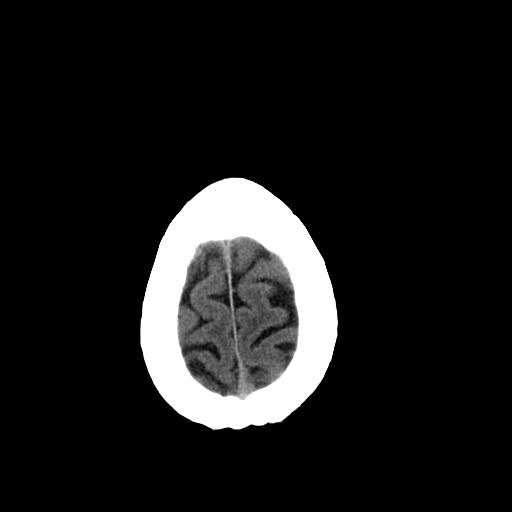
[im 29/32  brain]
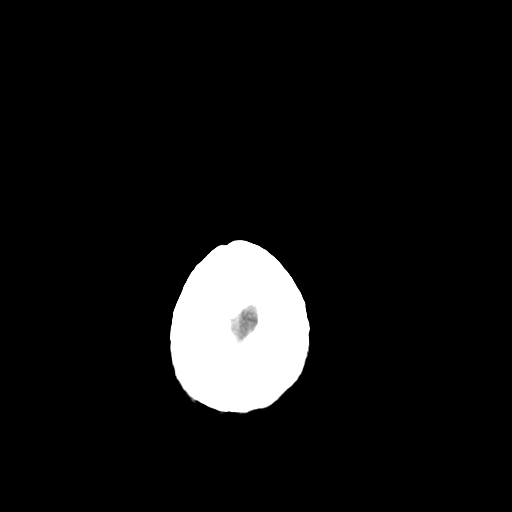
[im 29/32  bone]
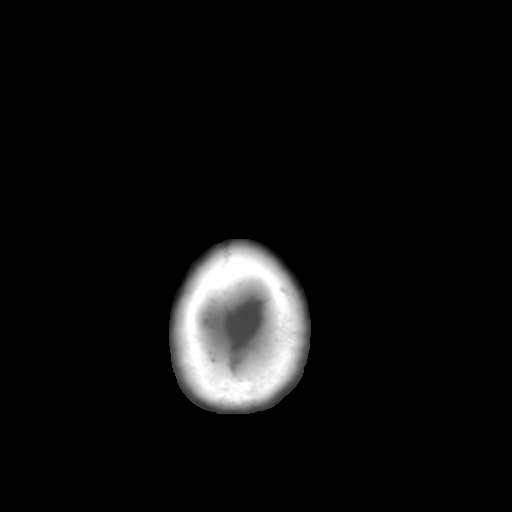

[13 of 30 positions shown; findings below may reference images not displayed]

FINDINGS: No evidence of parenchymal hemorrhage or extra-axial
fluid collection. No mass lesion, mass effect, or midline shift.

No CT evidence of acute infarction.

Subcortical white matter and periventricular small vessel ischemic
changes.

Global cortical atrophy.  No ventriculomegaly.

The visualized paranasal sinuses are essentially clear. The mastoid
air cells are unopacified.

No evidence of calvarial fracture.
IMPRESSION: No evidence of acute intracranial abnormality.

Atrophy with small vessel ischemic changes.

## 2015-02-23 IMAGING — CR DG CHEST 1V PORT
1 series · 1 of 1 positions shown · non-contrast
Comparison: 05/24/2013, 05/21/2013

CLINICAL DATA: Worsening cough

PORTABLE CHEST - 1 VIEW

[AP]
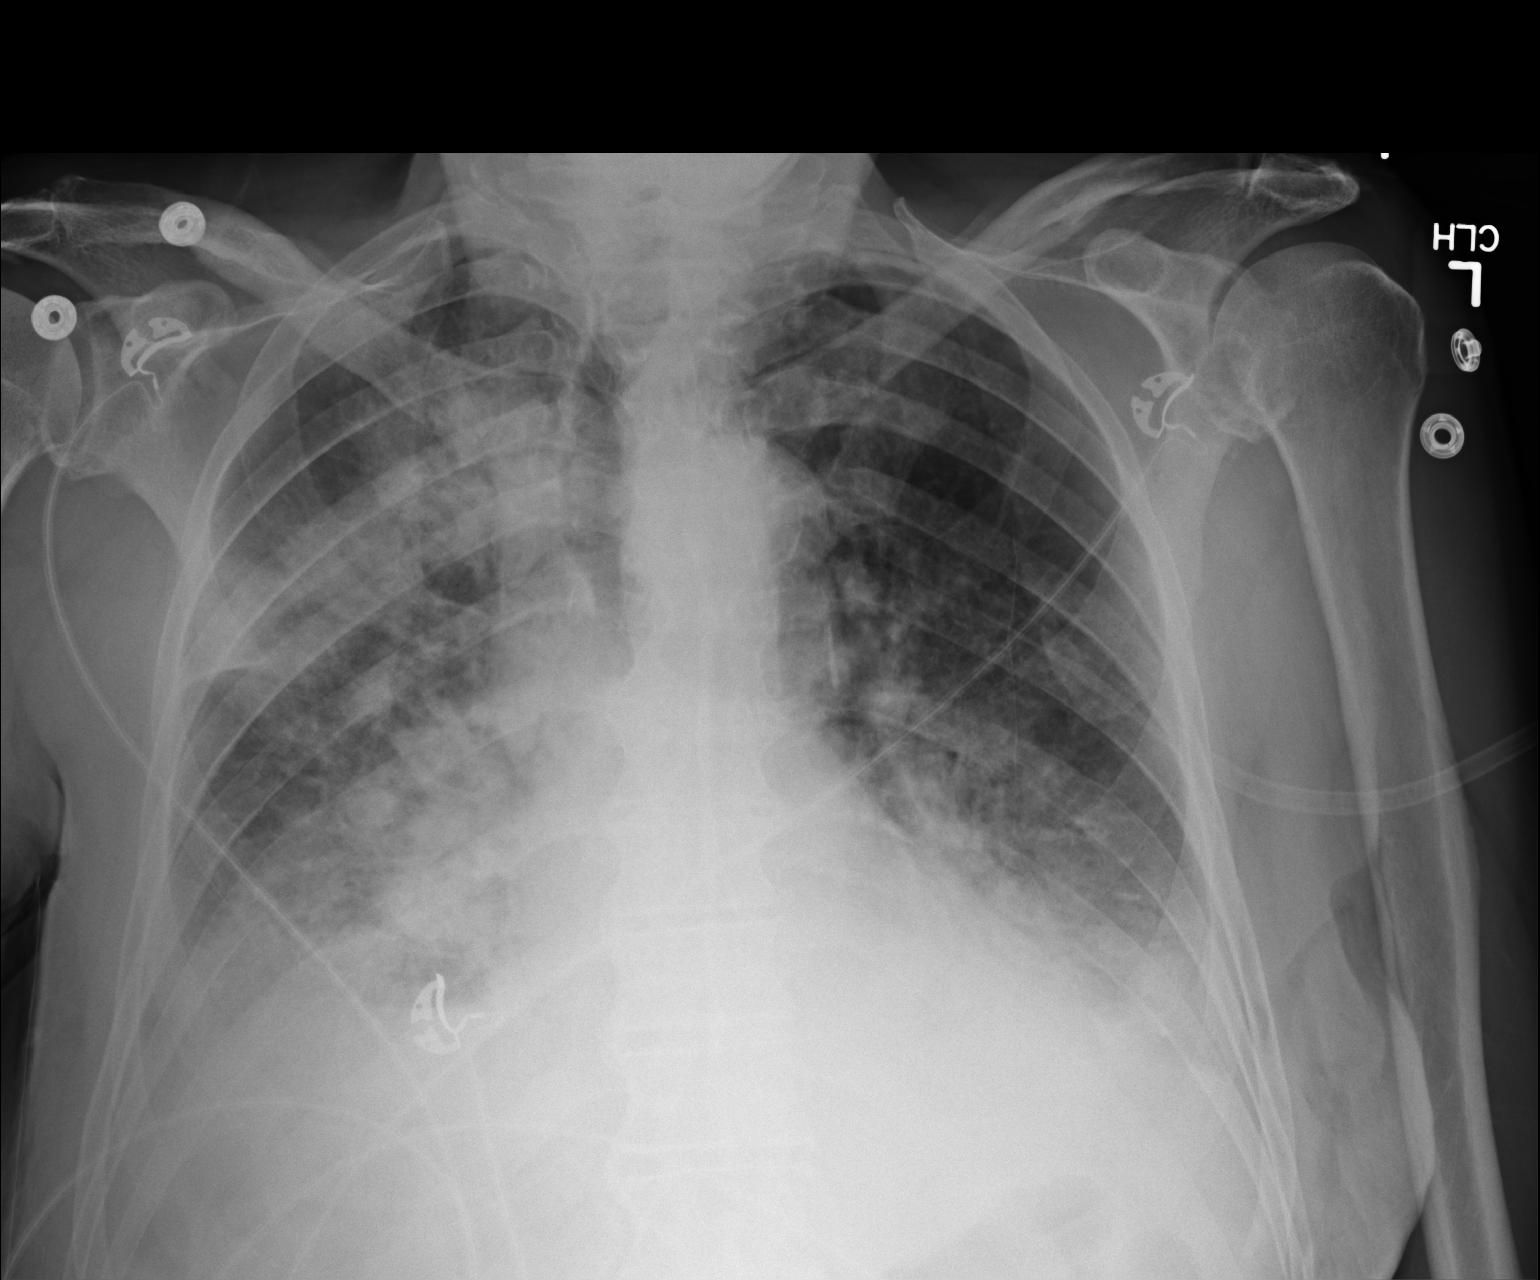

[1 of 1 positions shown; findings below may reference images not displayed]

FINDINGS: Worsening consolidative airspace process in the lower
lobes and right upper lobe compatible with multilobar pneumonia
versus asymmetric edema.  No definite pleural effusion.  No
pneumothorax.  Cardiac contours are obscured.  Atherosclerosis of
the aorta.  Degenerative changes of the spine.
IMPRESSION: Worsening bibasilar and right upper lobe consolidative airspace
disease compatible with pneumonia or asymmetric alveolar edema.

## 2015-07-24 NOTE — Telephone Encounter (Signed)
Error
